# Patient Record
Sex: Female | Born: 1975 | Race: White | Hispanic: No | Smoking: Former smoker
Health system: Southern US, Community
[De-identification: ages and names within clinical notes are randomized; demographics above are authoritative.]

## PROBLEM LIST (undated history)

## (undated) DIAGNOSIS — J189 Pneumonia, unspecified organism: Secondary | ICD-10-CM

## (undated) DIAGNOSIS — M1990 Unspecified osteoarthritis, unspecified site: Secondary | ICD-10-CM

## (undated) DIAGNOSIS — J302 Other seasonal allergic rhinitis: Secondary | ICD-10-CM

## (undated) DIAGNOSIS — F419 Anxiety disorder, unspecified: Secondary | ICD-10-CM

## (undated) DIAGNOSIS — K219 Gastro-esophageal reflux disease without esophagitis: Secondary | ICD-10-CM

## (undated) DIAGNOSIS — M329 Systemic lupus erythematosus, unspecified: Secondary | ICD-10-CM

## (undated) DIAGNOSIS — J40 Bronchitis, not specified as acute or chronic: Secondary | ICD-10-CM

## (undated) HISTORY — DX: Gastro-esophageal reflux disease without esophagitis: K21.9

## (undated) HISTORY — PX: TEMPOROMANDIBULAR JOINT SURGERY: SHX35

## (undated) HISTORY — DX: Unspecified osteoarthritis, unspecified site: M19.90

## (undated) HISTORY — PX: TONSILLECTOMY: SUR1361

## (undated) HISTORY — PX: OTHER SURGICAL HISTORY: SHX169

## (undated) HISTORY — PX: TUBAL LIGATION: SHX77

## (undated) HISTORY — DX: Anxiety disorder, unspecified: F41.9

---

## 1997-05-04 ENCOUNTER — Other Ambulatory Visit: Admission: RE | Admit: 1997-05-04 | Discharge: 1997-05-04 | Payer: Self-pay | Admitting: Obstetrics

## 1997-06-21 ENCOUNTER — Ambulatory Visit (HOSPITAL_COMMUNITY): Admission: RE | Admit: 1997-06-21 | Discharge: 1997-06-21 | Payer: Self-pay | Admitting: *Deleted

## 1997-07-27 ENCOUNTER — Inpatient Hospital Stay (HOSPITAL_COMMUNITY): Admission: AD | Admit: 1997-07-27 | Discharge: 1997-07-27 | Payer: Self-pay | Admitting: *Deleted

## 1998-12-16 ENCOUNTER — Emergency Department (HOSPITAL_COMMUNITY): Admission: EM | Admit: 1998-12-16 | Discharge: 1998-12-16 | Payer: Self-pay | Admitting: Emergency Medicine

## 1999-01-10 ENCOUNTER — Encounter: Payer: Self-pay | Admitting: Family Medicine

## 1999-01-10 ENCOUNTER — Ambulatory Visit (HOSPITAL_COMMUNITY): Admission: RE | Admit: 1999-01-10 | Discharge: 1999-01-10 | Payer: Self-pay | Admitting: Family Medicine

## 1999-09-04 ENCOUNTER — Emergency Department (HOSPITAL_COMMUNITY): Admission: EM | Admit: 1999-09-04 | Discharge: 1999-09-04 | Payer: Self-pay | Admitting: Emergency Medicine

## 1999-10-22 ENCOUNTER — Encounter: Payer: Self-pay | Admitting: *Deleted

## 1999-10-22 ENCOUNTER — Emergency Department (HOSPITAL_COMMUNITY): Admission: EM | Admit: 1999-10-22 | Discharge: 1999-10-22 | Payer: Self-pay | Admitting: Emergency Medicine

## 2000-04-05 ENCOUNTER — Emergency Department (HOSPITAL_COMMUNITY): Admission: EM | Admit: 2000-04-05 | Discharge: 2000-04-05 | Payer: Self-pay | Admitting: Emergency Medicine

## 2000-04-05 ENCOUNTER — Encounter: Payer: Self-pay | Admitting: Emergency Medicine

## 2000-04-25 ENCOUNTER — Inpatient Hospital Stay (HOSPITAL_COMMUNITY): Admission: AD | Admit: 2000-04-25 | Discharge: 2000-04-25 | Payer: Self-pay | Admitting: Obstetrics

## 2000-04-28 ENCOUNTER — Inpatient Hospital Stay (HOSPITAL_COMMUNITY): Admission: AD | Admit: 2000-04-28 | Discharge: 2000-04-28 | Payer: Self-pay | Admitting: Obstetrics

## 2000-08-17 ENCOUNTER — Other Ambulatory Visit: Admission: RE | Admit: 2000-08-17 | Discharge: 2000-08-17 | Payer: Self-pay | Admitting: Obstetrics and Gynecology

## 2000-09-03 ENCOUNTER — Emergency Department (HOSPITAL_COMMUNITY): Admission: EM | Admit: 2000-09-03 | Discharge: 2000-09-04 | Payer: Self-pay | Admitting: Emergency Medicine

## 2000-11-17 ENCOUNTER — Emergency Department (HOSPITAL_COMMUNITY): Admission: EM | Admit: 2000-11-17 | Discharge: 2000-11-17 | Payer: Self-pay | Admitting: Emergency Medicine

## 2001-01-24 ENCOUNTER — Emergency Department (HOSPITAL_COMMUNITY): Admission: EM | Admit: 2001-01-24 | Discharge: 2001-01-24 | Payer: Self-pay | Admitting: Emergency Medicine

## 2001-05-04 ENCOUNTER — Other Ambulatory Visit: Admission: RE | Admit: 2001-05-04 | Discharge: 2001-05-04 | Payer: Self-pay | Admitting: Obstetrics and Gynecology

## 2001-05-25 ENCOUNTER — Emergency Department (HOSPITAL_COMMUNITY): Admission: EM | Admit: 2001-05-25 | Discharge: 2001-05-25 | Payer: Self-pay | Admitting: Emergency Medicine

## 2001-06-29 ENCOUNTER — Emergency Department (HOSPITAL_COMMUNITY): Admission: EM | Admit: 2001-06-29 | Discharge: 2001-06-29 | Payer: Self-pay | Admitting: Emergency Medicine

## 2001-12-21 ENCOUNTER — Emergency Department (HOSPITAL_COMMUNITY): Admission: EM | Admit: 2001-12-21 | Discharge: 2001-12-21 | Payer: Self-pay | Admitting: Emergency Medicine

## 2002-06-28 ENCOUNTER — Emergency Department (HOSPITAL_COMMUNITY): Admission: EM | Admit: 2002-06-28 | Discharge: 2002-06-28 | Payer: Self-pay

## 2002-10-22 ENCOUNTER — Encounter: Payer: Self-pay | Admitting: Emergency Medicine

## 2002-10-22 ENCOUNTER — Emergency Department (HOSPITAL_COMMUNITY): Admission: EM | Admit: 2002-10-22 | Discharge: 2002-10-23 | Payer: Self-pay | Admitting: Emergency Medicine

## 2002-10-27 ENCOUNTER — Encounter: Payer: Self-pay | Admitting: Family Medicine

## 2002-10-27 ENCOUNTER — Ambulatory Visit (HOSPITAL_COMMUNITY): Admission: RE | Admit: 2002-10-27 | Discharge: 2002-10-27 | Payer: Self-pay | Admitting: Family Medicine

## 2002-10-28 ENCOUNTER — Emergency Department (HOSPITAL_COMMUNITY): Admission: EM | Admit: 2002-10-28 | Discharge: 2002-10-29 | Payer: Self-pay | Admitting: *Deleted

## 2002-10-29 ENCOUNTER — Encounter: Payer: Self-pay | Admitting: Emergency Medicine

## 2002-11-07 ENCOUNTER — Emergency Department (HOSPITAL_COMMUNITY): Admission: EM | Admit: 2002-11-07 | Discharge: 2002-11-08 | Payer: Self-pay

## 2002-11-17 ENCOUNTER — Ambulatory Visit (HOSPITAL_COMMUNITY): Admission: RE | Admit: 2002-11-17 | Discharge: 2002-11-17 | Payer: Self-pay | Admitting: Gastroenterology

## 2002-11-17 ENCOUNTER — Encounter: Payer: Self-pay | Admitting: Gastroenterology

## 2002-11-21 ENCOUNTER — Encounter: Payer: Self-pay | Admitting: Gastroenterology

## 2002-11-21 ENCOUNTER — Ambulatory Visit (HOSPITAL_COMMUNITY): Admission: RE | Admit: 2002-11-21 | Discharge: 2002-11-21 | Payer: Self-pay | Admitting: Gastroenterology

## 2003-03-05 ENCOUNTER — Ambulatory Visit (HOSPITAL_COMMUNITY): Admission: RE | Admit: 2003-03-05 | Discharge: 2003-03-05 | Payer: Self-pay | Admitting: Gastroenterology

## 2003-05-17 ENCOUNTER — Inpatient Hospital Stay (HOSPITAL_COMMUNITY): Admission: AD | Admit: 2003-05-17 | Discharge: 2003-05-17 | Payer: Self-pay | Admitting: Obstetrics & Gynecology

## 2003-12-20 ENCOUNTER — Emergency Department (HOSPITAL_COMMUNITY): Admission: EM | Admit: 2003-12-20 | Discharge: 2003-12-20 | Payer: Self-pay | Admitting: Emergency Medicine

## 2004-01-01 ENCOUNTER — Other Ambulatory Visit: Admission: RE | Admit: 2004-01-01 | Discharge: 2004-01-01 | Payer: Self-pay | Admitting: Family Medicine

## 2004-01-01 ENCOUNTER — Ambulatory Visit: Payer: Self-pay | Admitting: Family Medicine

## 2004-10-29 ENCOUNTER — Emergency Department (HOSPITAL_COMMUNITY): Admission: EM | Admit: 2004-10-29 | Discharge: 2004-10-29 | Payer: Self-pay | Admitting: Emergency Medicine

## 2004-12-03 ENCOUNTER — Emergency Department (HOSPITAL_COMMUNITY): Admission: EM | Admit: 2004-12-03 | Discharge: 2004-12-04 | Payer: Self-pay | Admitting: Emergency Medicine

## 2004-12-14 ENCOUNTER — Inpatient Hospital Stay (HOSPITAL_COMMUNITY): Admission: AD | Admit: 2004-12-14 | Discharge: 2004-12-14 | Payer: Self-pay | Admitting: Family Medicine

## 2005-01-08 ENCOUNTER — Emergency Department (HOSPITAL_COMMUNITY): Admission: EM | Admit: 2005-01-08 | Discharge: 2005-01-08 | Payer: Self-pay | Admitting: Family Medicine

## 2005-02-10 ENCOUNTER — Emergency Department (HOSPITAL_COMMUNITY): Admission: EM | Admit: 2005-02-10 | Discharge: 2005-02-10 | Payer: Self-pay | Admitting: Emergency Medicine

## 2005-04-21 ENCOUNTER — Emergency Department (HOSPITAL_COMMUNITY): Admission: EM | Admit: 2005-04-21 | Discharge: 2005-04-21 | Payer: Self-pay | Admitting: Family Medicine

## 2005-09-10 ENCOUNTER — Ambulatory Visit: Payer: Self-pay | Admitting: Family Medicine

## 2005-10-01 ENCOUNTER — Encounter (INDEPENDENT_AMBULATORY_CARE_PROVIDER_SITE_OTHER): Payer: Self-pay | Admitting: Family Medicine

## 2005-10-01 ENCOUNTER — Other Ambulatory Visit: Admission: RE | Admit: 2005-10-01 | Discharge: 2005-10-01 | Payer: Self-pay | Admitting: Family Medicine

## 2005-10-01 ENCOUNTER — Ambulatory Visit: Payer: Self-pay | Admitting: Family Medicine

## 2005-10-18 ENCOUNTER — Emergency Department (HOSPITAL_COMMUNITY): Admission: EM | Admit: 2005-10-18 | Discharge: 2005-10-18 | Payer: Self-pay | Admitting: Family Medicine

## 2005-11-30 ENCOUNTER — Ambulatory Visit: Payer: Self-pay | Admitting: Internal Medicine

## 2005-12-01 ENCOUNTER — Ambulatory Visit (HOSPITAL_COMMUNITY): Admission: RE | Admit: 2005-12-01 | Discharge: 2005-12-01 | Payer: Self-pay | Admitting: Internal Medicine

## 2006-02-15 ENCOUNTER — Emergency Department (HOSPITAL_COMMUNITY): Admission: EM | Admit: 2006-02-15 | Discharge: 2006-02-15 | Payer: Self-pay | Admitting: Family Medicine

## 2006-02-23 ENCOUNTER — Ambulatory Visit: Payer: Self-pay | Admitting: Family Medicine

## 2006-06-17 ENCOUNTER — Ambulatory Visit: Payer: Self-pay | Admitting: Internal Medicine

## 2006-08-02 ENCOUNTER — Ambulatory Visit (HOSPITAL_COMMUNITY): Admission: RE | Admit: 2006-08-02 | Discharge: 2006-08-02 | Payer: Self-pay | Admitting: Internal Medicine

## 2006-08-10 ENCOUNTER — Emergency Department (HOSPITAL_COMMUNITY): Admission: EM | Admit: 2006-08-10 | Discharge: 2006-08-10 | Payer: Self-pay | Admitting: Family Medicine

## 2006-08-16 IMAGING — CR DG LUMBAR SPINE COMPLETE 4+V
5 series · 5 of 5 positions shown · non-contrast
Comparison: none

CLINICAL DATA: Low back pain after fall

LUMBAR SPINE FIVE-VIEW

[view not recorded (1 of 5)]
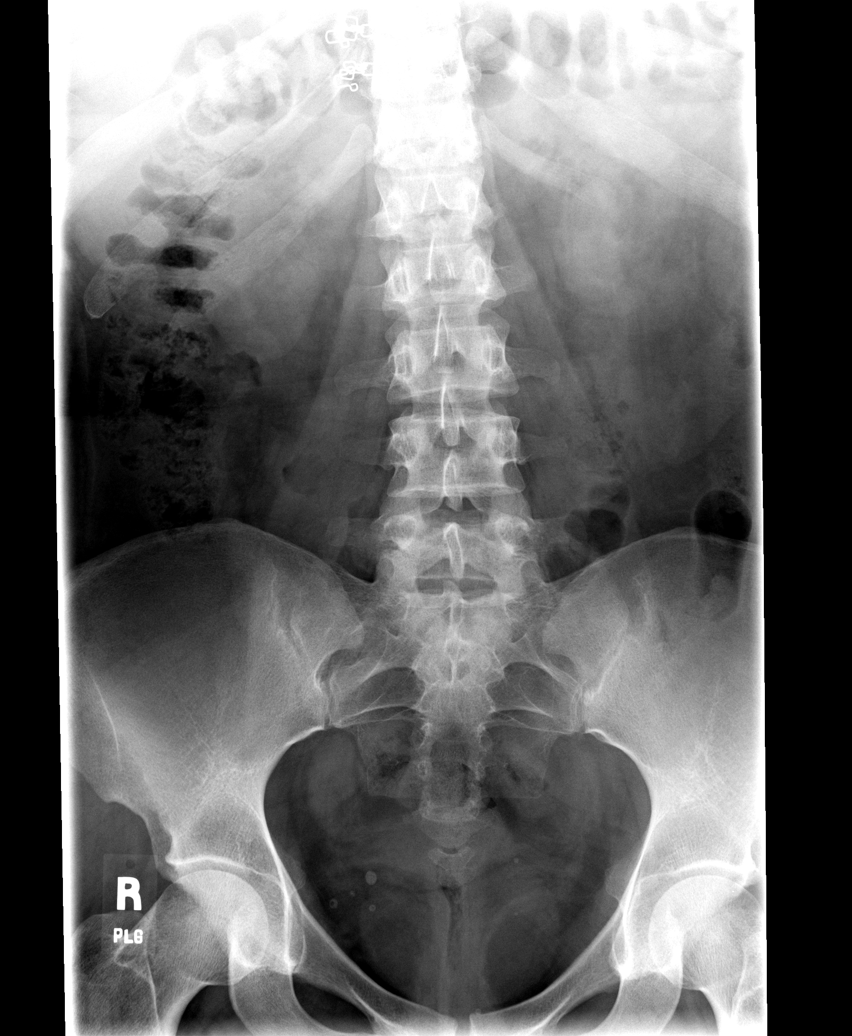

[view not recorded (2 of 5)]
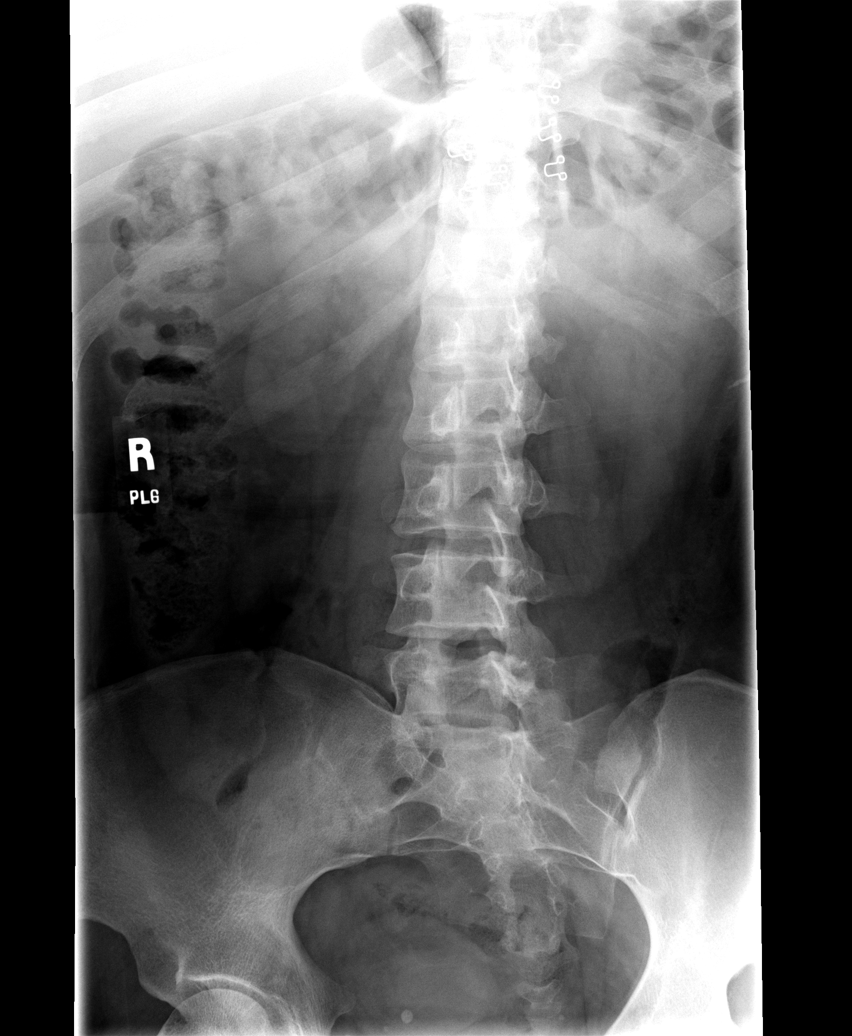

[view not recorded (3 of 5)]
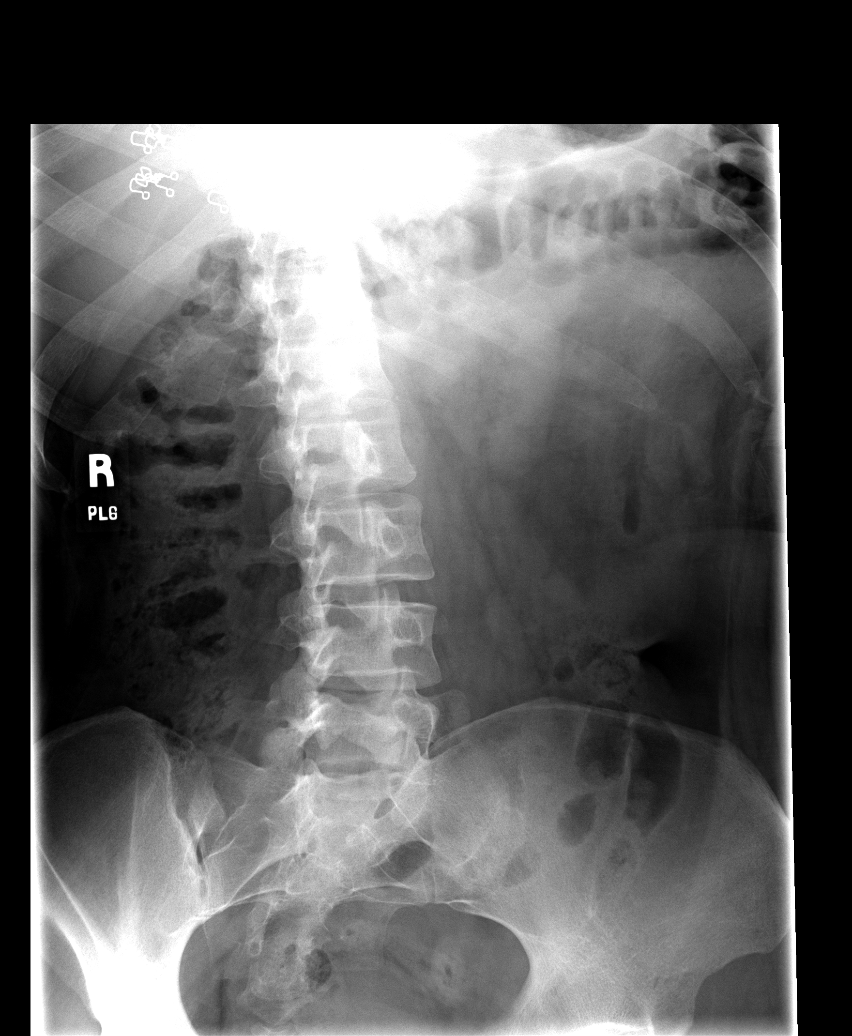

[view not recorded (4 of 5)]
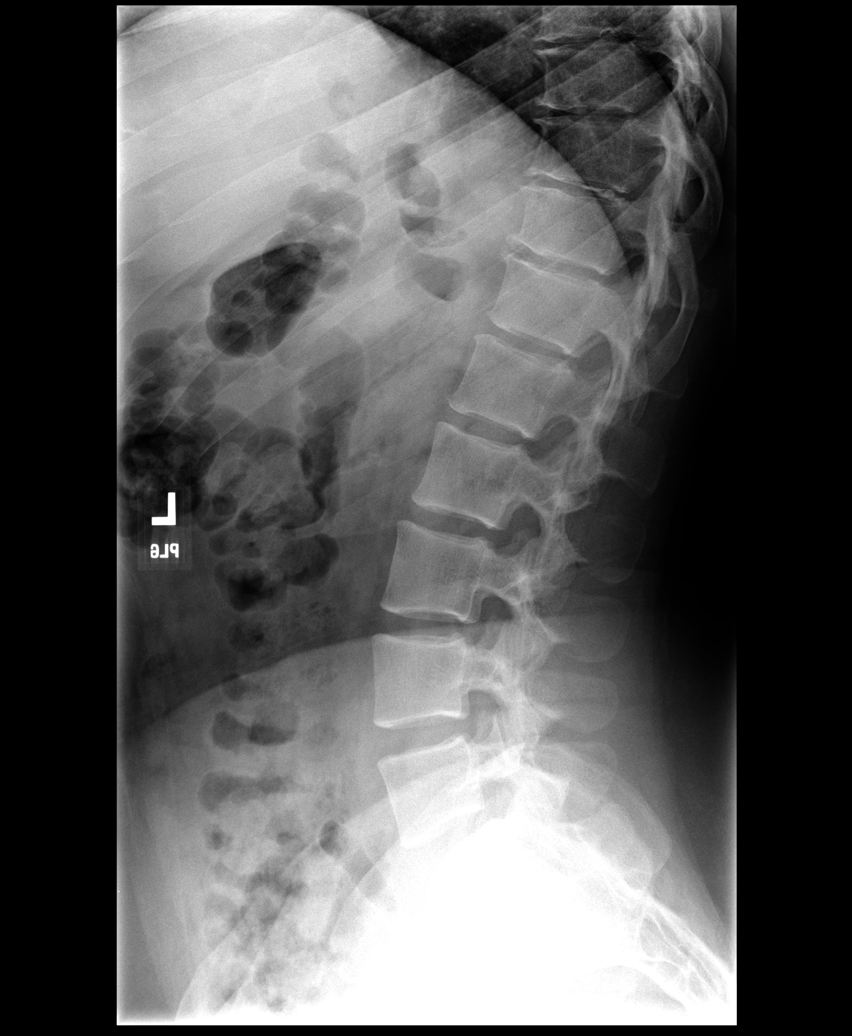

[view not recorded (5 of 5)]
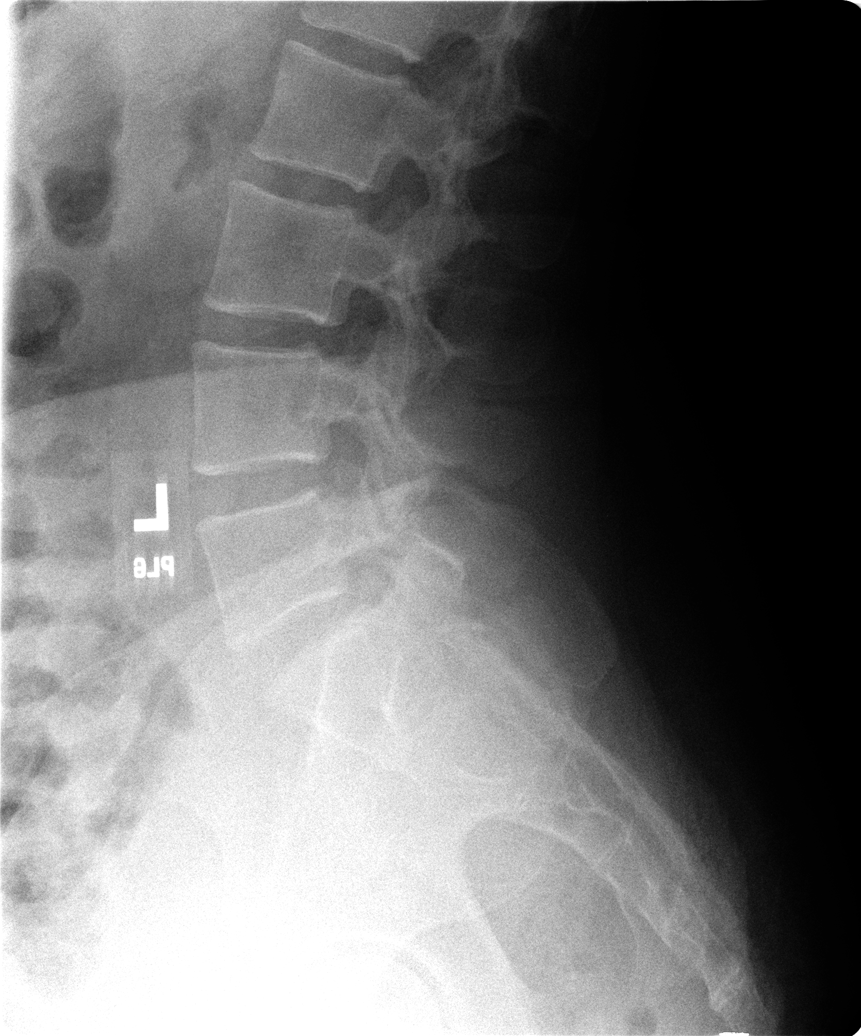

[5 of 5 positions shown; findings below may reference images not displayed]

FINDINGS: No previous for comparison. Small anterior end plate spurs are noted in the lower
thoracic spine. Normal alignment. Negative for fracture, dislocation, or other acute bone
abnormality.
IMPRESSION: 1. Negative lumbar spine.
2. Lower thoracic spine spurring.

## 2006-10-26 ENCOUNTER — Emergency Department (HOSPITAL_COMMUNITY): Admission: EM | Admit: 2006-10-26 | Discharge: 2006-10-26 | Payer: Self-pay | Admitting: Emergency Medicine

## 2006-11-07 ENCOUNTER — Emergency Department (HOSPITAL_COMMUNITY): Admission: EM | Admit: 2006-11-07 | Discharge: 2006-11-07 | Payer: Self-pay | Admitting: Emergency Medicine

## 2007-06-20 ENCOUNTER — Inpatient Hospital Stay (HOSPITAL_COMMUNITY): Admission: AD | Admit: 2007-06-20 | Discharge: 2007-06-20 | Payer: Self-pay | Admitting: Obstetrics & Gynecology

## 2007-09-28 ENCOUNTER — Emergency Department (HOSPITAL_COMMUNITY): Admission: EM | Admit: 2007-09-28 | Discharge: 2007-09-28 | Payer: Self-pay | Admitting: Emergency Medicine

## 2008-01-26 ENCOUNTER — Emergency Department (HOSPITAL_COMMUNITY): Admission: EM | Admit: 2008-01-26 | Discharge: 2008-01-26 | Payer: Self-pay | Admitting: Emergency Medicine

## 2008-01-27 ENCOUNTER — Telehealth (INDEPENDENT_AMBULATORY_CARE_PROVIDER_SITE_OTHER): Payer: Self-pay | Admitting: *Deleted

## 2008-02-03 ENCOUNTER — Ambulatory Visit: Payer: Self-pay | Admitting: Nurse Practitioner

## 2008-02-03 DIAGNOSIS — N63 Unspecified lump in unspecified breast: Secondary | ICD-10-CM

## 2008-02-13 ENCOUNTER — Encounter: Admission: RE | Admit: 2008-02-13 | Discharge: 2008-02-13 | Payer: Self-pay | Admitting: Internal Medicine

## 2008-02-21 ENCOUNTER — Emergency Department (HOSPITAL_COMMUNITY): Admission: EM | Admit: 2008-02-21 | Discharge: 2008-02-21 | Payer: Self-pay | Admitting: Emergency Medicine

## 2008-04-17 ENCOUNTER — Emergency Department (HOSPITAL_COMMUNITY): Admission: EM | Admit: 2008-04-17 | Discharge: 2008-04-18 | Payer: Self-pay | Admitting: Emergency Medicine

## 2008-04-20 ENCOUNTER — Telehealth (INDEPENDENT_AMBULATORY_CARE_PROVIDER_SITE_OTHER): Payer: Self-pay | Admitting: Internal Medicine

## 2008-10-20 ENCOUNTER — Encounter (INDEPENDENT_AMBULATORY_CARE_PROVIDER_SITE_OTHER): Payer: Self-pay | Admitting: Internal Medicine

## 2008-10-20 ENCOUNTER — Emergency Department (HOSPITAL_COMMUNITY): Admission: EM | Admit: 2008-10-20 | Discharge: 2008-10-20 | Payer: Self-pay | Admitting: Emergency Medicine

## 2008-10-23 ENCOUNTER — Emergency Department (HOSPITAL_COMMUNITY): Admission: EM | Admit: 2008-10-23 | Discharge: 2008-10-23 | Payer: Self-pay | Admitting: Emergency Medicine

## 2008-10-24 ENCOUNTER — Telehealth (INDEPENDENT_AMBULATORY_CARE_PROVIDER_SITE_OTHER): Payer: Self-pay | Admitting: Internal Medicine

## 2008-10-31 ENCOUNTER — Telehealth (INDEPENDENT_AMBULATORY_CARE_PROVIDER_SITE_OTHER): Payer: Self-pay | Admitting: Internal Medicine

## 2008-11-07 ENCOUNTER — Encounter (INDEPENDENT_AMBULATORY_CARE_PROVIDER_SITE_OTHER): Payer: Self-pay | Admitting: *Deleted

## 2008-11-13 ENCOUNTER — Ambulatory Visit: Payer: Self-pay | Admitting: Internal Medicine

## 2008-11-13 DIAGNOSIS — IMO0002 Reserved for concepts with insufficient information to code with codable children: Secondary | ICD-10-CM | POA: Insufficient documentation

## 2008-11-15 ENCOUNTER — Encounter (INDEPENDENT_AMBULATORY_CARE_PROVIDER_SITE_OTHER): Payer: Self-pay | Admitting: Internal Medicine

## 2008-11-16 ENCOUNTER — Ambulatory Visit (HOSPITAL_COMMUNITY): Admission: RE | Admit: 2008-11-16 | Discharge: 2008-11-16 | Payer: Self-pay | Admitting: Internal Medicine

## 2008-11-16 ENCOUNTER — Telehealth (INDEPENDENT_AMBULATORY_CARE_PROVIDER_SITE_OTHER): Payer: Self-pay | Admitting: Internal Medicine

## 2008-11-18 ENCOUNTER — Emergency Department (HOSPITAL_COMMUNITY): Admission: EM | Admit: 2008-11-18 | Discharge: 2008-11-18 | Payer: Self-pay | Admitting: Emergency Medicine

## 2008-11-21 ENCOUNTER — Telehealth (INDEPENDENT_AMBULATORY_CARE_PROVIDER_SITE_OTHER): Payer: Self-pay | Admitting: Internal Medicine

## 2009-07-01 ENCOUNTER — Emergency Department (HOSPITAL_COMMUNITY): Admission: EM | Admit: 2009-07-01 | Discharge: 2009-07-01 | Payer: Self-pay | Admitting: Emergency Medicine

## 2010-06-11 ENCOUNTER — Encounter: Payer: Self-pay | Admitting: Cardiovascular Disease

## 2010-06-21 LAB — URINALYSIS, ROUTINE W REFLEX MICROSCOPIC
Glucose, UA: NEGATIVE mg/dL
Ketones, ur: NEGATIVE mg/dL
Leukocytes, UA: NEGATIVE
Nitrite: NEGATIVE
Protein, ur: NEGATIVE mg/dL
pH: 6 (ref 5.0–8.0)

## 2010-06-21 LAB — POCT PREGNANCY, URINE: Preg Test, Ur: NEGATIVE

## 2010-06-21 LAB — URINE MICROSCOPIC-ADD ON

## 2010-06-25 ENCOUNTER — Ambulatory Visit: Payer: Self-pay | Admitting: Cardiovascular Disease

## 2010-07-01 LAB — GLUCOSE, CSF: Glucose, CSF: 59 mg/dL (ref 43–76)

## 2010-07-01 LAB — BASIC METABOLIC PANEL
BUN: 6 mg/dL (ref 6–23)
Creatinine, Ser: 0.77 mg/dL (ref 0.4–1.2)
GFR calc Af Amer: >60 mL/min (ref >60)
GFR calc non Af Amer: >60 mL/min (ref >60)
Sodium: 135 mEq/L (ref 135–145)

## 2010-07-01 LAB — CULTURE, BLOOD (ROUTINE X 2)
Culture: NO GROWTH
Culture: NO GROWTH

## 2010-07-01 LAB — DIFFERENTIAL
Lymphocytes Relative: 40 % (ref 12–46)
Monocytes Absolute: 0.5 10*3/uL (ref 0.1–1.0)
Neutro Abs: 5.6 10*3/uL (ref 1.7–7.7)

## 2010-07-01 LAB — CSF CELL COUNT WITH DIFFERENTIAL

## 2010-07-01 LAB — GRAM STAIN: Gram Stain: NONE SEEN

## 2010-07-01 LAB — CSF CULTURE W GRAM STAIN: Gram Stain: NONE SEEN

## 2010-07-01 LAB — CBC
MCV: 101.4 fL — ABNORMAL HIGH (ref 78.0–100.0)
RBC: 3.92 MIL/uL (ref 3.87–5.11)
RDW: 12.2 % (ref 11.5–15.5)

## 2010-07-15 ENCOUNTER — Encounter: Payer: Self-pay | Admitting: Cardiovascular Disease

## 2010-08-01 NOTE — Op Note (Signed)
NAME:  Michele Herrera, Michele Herrera                       ACCOUNT NO.:  [id number]   MEDICAL RECORD NO.:  [id number]                   PATIENT TYPE:  AMB   LOCATION:  ENDO                                 FACILITY:  [facility]   PHYSICIAN:  Graylin Shiver, M.D.                DATE OF BIRTH:  07/23/1975   DATE OF PROCEDURE:  03/05/2003  DATE OF DISCHARGE:                                 OPERATIVE REPORT   PROCEDURE PERFORMED:  Esophagogastroduodenoscopy with biopsy for CLO test.   ENDOSCOPIST:  Graylin Shiver, M.D.   INDICATIONS FOR PROCEDURE:  Epigastric pain.  Informed consent was obtained  after explanation of the risks of bleeding, infection and perforation.   PREMEDICATIONS:  Fentanyl 100 mcg IV, Versed 10 mg IV.   DESCRIPTION OF PROCEDURE:  With the patient in the left lateral decubitus  position the Olympus gastroscope was inserted into the oropharynx and passed  into the esophagus.  It was advanced down the esophagus and into the stomach  and into the duodenum.  The second portion and bulb of the duodenum were  normal.  The stomach showed a diffuse mild erythematous appearance to the  mucosa consistent with gastritis.  No ulcers or erosions were seen.  Biopsy  for CLO test was obtained.  No lesions were seen in the fundus or cardia on  retroflexion.  The esophagus was normal.  The esophagogastric junction was  at 38 cm.  The patient tolerated the procedure well without complications.   IMPRESSION:  Gastritis.   PLAN:  The CLO test will be checked.  I would recommend that she remain on a  proton pump inhibitor for suppression of acid.                                               Graylin Shiver, M.D.    SFG/MEDQ  D:  03/05/2003  T:  03/06/2003  Job:  [id number]   cc:   Fanny Dance. Rankins, M.D.  [address]  [place]  [place] [address]  Fax: [phone]

## 2010-08-13 ENCOUNTER — Encounter: Payer: Self-pay | Admitting: Cardiovascular Disease

## 2010-08-26 ENCOUNTER — Encounter: Payer: Self-pay | Admitting: Cardiovascular Disease

## 2010-12-09 LAB — WET PREP, GENITAL
Clue Cells Wet Prep HPF POC: NONE SEEN
Trich, Wet Prep: NONE SEEN

## 2010-12-09 LAB — URINALYSIS, ROUTINE W REFLEX MICROSCOPIC
Glucose, UA: NEGATIVE
Leukocytes, UA: NEGATIVE
Nitrite: NEGATIVE
pH: 5.5

## 2010-12-09 LAB — URINE MICROSCOPIC-ADD ON

## 2010-12-09 LAB — GC/CHLAMYDIA PROBE AMP, GENITAL: Chlamydia, DNA Probe: POSITIVE — AB

## 2011-02-10 ENCOUNTER — Emergency Department (INDEPENDENT_AMBULATORY_CARE_PROVIDER_SITE_OTHER)
Admission: EM | Admit: 2011-02-10 | Discharge: 2011-02-10 | Disposition: A | Discharge status: Home / Self Care | Payer: PRIVATE HEALTH INSURANCE | Source: Home / Self Care | Attending: Family Medicine | Admitting: Family Medicine

## 2011-02-10 ENCOUNTER — Encounter (HOSPITAL_COMMUNITY): Payer: Self-pay | Admitting: Cardiology

## 2011-02-10 ENCOUNTER — Ambulatory Visit (HOSPITAL_COMMUNITY)
Admission: RE | Admit: 2011-02-10 | Discharge: 2011-02-10 | Disposition: A | Discharge status: Home / Self Care | Payer: PRIVATE HEALTH INSURANCE | Source: Ambulatory Visit | Attending: Family Medicine | Admitting: Family Medicine

## 2011-02-10 DIAGNOSIS — J40 Bronchitis, not specified as acute or chronic: Secondary | ICD-10-CM

## 2011-02-10 DIAGNOSIS — R05 Cough: Secondary | ICD-10-CM | POA: Insufficient documentation

## 2011-02-10 DIAGNOSIS — R059 Cough, unspecified: Secondary | ICD-10-CM | POA: Insufficient documentation

## 2011-02-10 DIAGNOSIS — R509 Fever, unspecified: Secondary | ICD-10-CM | POA: Insufficient documentation

## 2011-02-10 MED ORDER — GUAIFENESIN-CODEINE 100-10 MG/5ML PO SYRP: 10.0000 mL | ORAL_SOLUTION | Freq: Three times a day (TID) | ORAL | Status: AC | PRN

## 2011-02-10 MED ORDER — CEFUROXIME AXETIL 250 MG PO TABS: 250.0000 mg | ORAL_TABLET | Freq: Two times a day (BID) | ORAL | Status: AC

## 2011-02-10 NOTE — ED Provider Notes (Signed)
History     CSN: [id number] Arrival date & time: 02/10/2011 10:32 AM   First MD Initiated Contact with Patient 02/10/11 1104      Chief Complaint  Patient presents with  . Cough  . Chest Pain  . Nasal Congestion  . Sore Throat    (Consider location/radiation/quality/duration/timing/severity/associated sxs/prior treatment) Patient is a 35 y.o. female presenting with cough, chest pain, and pharyngitis. The history is provided by the patient.  Cough This is a new problem. The current episode started more than 2 days ago. The problem occurs constantly. The cough is productive of purulent sputum. There has been no fever. Associated symptoms include chest pain, chills, rhinorrhea, sore throat and shortness of breath. She is a smoker.  Chest Pain Primary symptoms include shortness of breath and cough. Pertinent negatives for primary symptoms include no fever.    Sore Throat Associated symptoms include chest pain and shortness of breath.    Past Medical History  Diagnosis Date  . GERD (gastroesophageal reflux disease)     Past Surgical History  Procedure Date  . Tonsillectomy   . Tubal ligation   . Tmj surgeries     Family History  Problem Relation Age of Onset  . Coronary artery disease    . Cancer    . Arthritis      History  Substance Use Topics  . Smoking status: Current Everyday Smoker  . Smokeless tobacco: Not on file  . Alcohol Use: Yes    OB History    Grav Para Term Preterm Abortions TAB SAB Ect Mult Living                  Review of Systems  Constitutional: Positive for chills. Negative for fever.  HENT: Positive for congestion, sore throat, rhinorrhea and postnasal drip.   Respiratory: Positive for cough and shortness of breath.   Cardiovascular: Positive for chest pain.  Gastrointestinal: Negative.   Skin: Negative.     Allergies  Review of patient's allergies indicates no known allergies.  Home Medications   Current Outpatient Rx  Name  Route Sig Dispense Refill  . OVER THE COUNTER MEDICATION  Pt stated taking nyquil and dayquil with no relief.     . CYCLOBENZAPRINE HCL 10 MG PO TABS Oral Take 10 mg by mouth 3 (three) times daily as needed.      Marland Kitchen TRAMADOL HCL 50 MG PO TABS Oral Take 50 mg by mouth every 6 (six) hours as needed.        BP 108/67  Pulse 66  Temp(Src) 98.2 F (36.8 C) (Oral)  Resp 20  SpO2 100%  LMP 02/08/2011  Physical Exam  Nursing note and vitals reviewed. Constitutional: She appears well-developed and well-nourished.  HENT:  Head: Normocephalic.  Right Ear: External ear normal.  Left Ear: External ear normal.  Mouth/Throat: Oropharynx is clear and moist.  Eyes: Conjunctivae and EOM are normal. Pupils are equal, round, and reactive to light.  Neck: Normal range of motion. Neck supple.  Cardiovascular: Normal rate, regular rhythm, normal heart sounds and intact distal pulses.   Pulmonary/Chest: Effort normal. She has rales in the right middle field.  Abdominal: Soft. Bowel sounds are normal.  Lymphadenopathy:    She has no cervical adenopathy.  Skin: Skin is warm and dry.    ED Course  Procedures (including critical care time)  Labs Reviewed - No data to display No results found.   No diagnosis found.    MDM  X-rays reviewed  and report per radiologist.         Barkley Bruns, MD 02/10/11 1253

## 2011-02-10 NOTE — ED Notes (Signed)
Pt reports nasal congestion and cough for the past 4 days. With chest soreness from coughing. Can't take a deep breath without coughing. Pt denies fever but getting cold chills since Friday. Pt reports throat to be scratchy. Pt tol liquids poor appetite.

## 2011-08-02 ENCOUNTER — Encounter (HOSPITAL_COMMUNITY): Payer: Self-pay | Admitting: *Deleted

## 2011-08-02 ENCOUNTER — Emergency Department (INDEPENDENT_AMBULATORY_CARE_PROVIDER_SITE_OTHER)
Admission: EM | Admit: 2011-08-02 | Discharge: 2011-08-02 | Disposition: A | Discharge status: Home / Self Care | Payer: PRIVATE HEALTH INSURANCE | Source: Home / Self Care | Attending: Emergency Medicine | Admitting: Emergency Medicine

## 2011-08-02 DIAGNOSIS — J40 Bronchitis, not specified as acute or chronic: Secondary | ICD-10-CM

## 2011-08-02 HISTORY — DX: Pneumonia, unspecified organism: J18.9

## 2011-08-02 HISTORY — DX: Other seasonal allergic rhinitis: J30.2

## 2011-08-02 HISTORY — DX: Bronchitis, not specified as acute or chronic: J40

## 2011-08-02 MED ORDER — AZITHROMYCIN 250 MG PO TABS: 250.0000 mg | ORAL_TABLET | Freq: Every day | ORAL | Status: AC

## 2011-08-02 MED ORDER — DEXAMETHASONE 4 MG PO TABS: ORAL_TABLET | ORAL | Status: DC

## 2011-08-02 MED ORDER — DEXAMETHASONE 4 MG PO TABS: 16.0000 mg | ORAL_TABLET | Freq: Two times a day (BID) | ORAL | Status: DC

## 2011-08-02 MED ORDER — GUAIFENESIN-CODEINE 100-10 MG/5ML PO SYRP: 5.0000 mL | ORAL_SOLUTION | Freq: Four times a day (QID) | ORAL | Status: AC | PRN

## 2011-08-02 MED ORDER — ALBUTEROL SULFATE HFA 108 (90 BASE) MCG/ACT IN AERS: 1.0000 | INHALATION_SPRAY | Freq: Four times a day (QID) | RESPIRATORY_TRACT | Status: DC | PRN

## 2011-08-02 MED ORDER — IBUPROFEN 600 MG PO TABS: 600.0000 mg | ORAL_TABLET | Freq: Four times a day (QID) | ORAL | Status: AC | PRN

## 2011-08-02 NOTE — ED Notes (Signed)
Pt with c/o cough/congestion onset x 3 days - bilateral rib pain - pt feels unable to take deep breath - taking otc meds without relief

## 2011-08-02 NOTE — ED Provider Notes (Signed)
History     CSN: [id number]  Arrival date & time 08/02/11  1451   First MD Initiated Contact with Patient 08/02/11 1500      Chief Complaint  Patient presents with  . Cough  . Nasal Congestion  . Chest Pain    (Consider location/radiation/quality/duration/timing/severity/associated sxs/prior treatment) HPI Comments: Patient reports cough productive of thick, yellowish sputum, chest tightness, wheezing, shortness of breath, fatigue. Reports achy chest pain after coughing. States she feels" sore " from the coughing and increased work of breathing. Unable asleep at night secondary to all the coughing. Also with rhinorrhea, postnasal drip, and sore, irritated throat. Is taking DayQuil, NyQuil without relief. No nausea, vomiting.  States she feels hot and cold, but has no documented fevers at home. Patient is a smoker. She states this feels similar to previous episodes of bronchitis/pneumonia. No abx in the past month.  Patient is a 36 y.o. female presenting with cough and chest pain. The history is provided by the patient. No language interpreter was used.  Cough This is a new problem. The current episode started more than 2 days ago. The problem occurs constantly. The problem has not changed since onset.The cough is productive of sputum. There has been no fever. Associated symptoms include chest pain, chills, rhinorrhea, sore throat, shortness of breath and wheezing. Pertinent negatives include no headaches and no myalgias. She has tried cough syrup for the symptoms. The treatment provided no relief. She is a smoker. Her past medical history is significant for bronchitis and pneumonia.  Chest Pain Primary symptoms include shortness of breath, cough and wheezing. Pertinent negatives for primary symptoms include no fever and no abdominal pain.     Past Medical History  Diagnosis Date  . GERD (gastroesophageal reflux disease)   . Seasonal allergies   . Bronchitis   . Pneumonia     Past  Surgical History  Procedure Date  . Tonsillectomy   . Tubal ligation   . Tmj surgeries     Family History  Problem Relation Age of Onset  . Coronary artery disease    . Cancer    . Arthritis      History  Substance Use Topics  . Smoking status: Current Everyday Smoker  . Smokeless tobacco: Not on file  . Alcohol Use: Yes    OB History    Grav Para Term Preterm Abortions TAB SAB Ect Mult Living                  Review of Systems  Constitutional: Positive for chills. Negative for fever.  HENT: Positive for sore throat and rhinorrhea.   Respiratory: Positive for cough, shortness of breath and wheezing.   Cardiovascular: Positive for chest pain. Negative for leg swelling.  Gastrointestinal: Negative for abdominal pain.  Musculoskeletal: Negative for myalgias.  Neurological: Negative for headaches.    Allergies  Review of patient's allergies indicates no known allergies.  Home Medications   Current Outpatient Rx  Name Route Sig Dispense Refill  . ALBUTEROL SULFATE HFA 108 (90 BASE) MCG/ACT IN AERS Inhalation Inhale 1-2 puffs into the lungs every 6 (six) hours as needed for wheezing. Dispense with aerochamber 1 Inhaler 0  . AZITHROMYCIN 250 MG PO TABS Oral Take 1 tablet (250 mg total) by mouth daily. 2 tabs po on day one, then one tablet po once daily on days 2-5. 6 tablet 0  . DEXAMETHASONE 4 MG PO TABS  4 tabs (16 mg)  po at once on day one,  4 tabs (16 mg)  po at once on day 2 8 tablet 0  . GUAIFENESIN-CODEINE 100-10 MG/5ML PO SYRP Oral Take 5 mLs by mouth 4 (four) times daily as needed for cough. 120 mL 0  . IBUPROFEN 600 MG PO TABS Oral Take 1 tablet (600 mg total) by mouth every 6 (six) hours as needed for pain. 30 tablet 0    BP 119/63  Pulse 87  Temp(Src) 99.4 F (37.4 C) (Oral)  Resp 18  SpO2 97%  LMP 07/16/2011  Physical Exam  Nursing note and vitals reviewed. Constitutional: She is oriented to person, place, and time. She appears well-developed and  well-nourished. No distress.  HENT:  Head: Normocephalic and atraumatic.  Nose: Nose normal.  Mouth/Throat: Oropharynx is clear and moist.       Tonsils surgically absent.   Eyes: Conjunctivae and EOM are normal.  Neck: Normal range of motion.  Cardiovascular: Normal rate, regular rhythm, normal heart sounds and intact distal pulses.   Pulmonary/Chest: Effort normal. No respiratory distress. She exhibits tenderness.       Scattered rhonchi that clear with coughing  Abdominal: Soft. Bowel sounds are normal. She exhibits no distension. There is no tenderness.  Musculoskeletal: Normal range of motion. She exhibits no edema.  Neurological: She is alert and oriented to person, place, and time.  Skin: Skin is warm and dry.  Psychiatric: She has a normal mood and affect. Her behavior is normal. Judgment and thought content normal.    ED Course  Procedures (including critical care time)  Labs Reviewed - No data to display No results found.   1. Bronchitis     MDM  Afebrile, satting well, occasional rhonchi clear with coughing on exam here today. Patient appears well. Deferring x-ray today. Patient is a smoker, so will send home with a Z-Pak to treat her bronchitis in addition steroids, beta agonists, antitussives.. Counseled patient about the importance of smoking cessation. She'll followup with her PMD  as needed.  Luiz Blare, MD 08/02/11 [phone]

## 2011-08-02 NOTE — Discharge Instructions (Signed)
Take two puffs from your albuterol inhaler every 4 hours. Finish the steroids unless your doctor tells you to stop. Finish the antibiotics, even if you feel better. You may take tylenol 1 gram up to 4 times a day as needed for pain. This with 600 mg of motrin is an effective combination for pain and fever. Make sure you drink extra fluids. Return if you get worse, have a fever >100.4, or any other concerns.   Go to www.goodrx.com to look up your medications. This will give you a list of where you can find your prescriptions at the most affordable prices.  

## 2011-08-07 ENCOUNTER — Emergency Department (INDEPENDENT_AMBULATORY_CARE_PROVIDER_SITE_OTHER)
Admission: EM | Admit: 2011-08-07 | Discharge: 2011-08-07 | Disposition: A | Discharge status: Home / Self Care | Payer: PRIVATE HEALTH INSURANCE | Source: Home / Self Care | Attending: Emergency Medicine | Admitting: Emergency Medicine

## 2011-08-07 ENCOUNTER — Encounter (HOSPITAL_COMMUNITY): Payer: Self-pay | Admitting: Emergency Medicine

## 2011-08-07 ENCOUNTER — Emergency Department (INDEPENDENT_AMBULATORY_CARE_PROVIDER_SITE_OTHER): Payer: PRIVATE HEALTH INSURANCE

## 2011-08-07 DIAGNOSIS — R0602 Shortness of breath: Secondary | ICD-10-CM

## 2011-08-07 DIAGNOSIS — R05 Cough: Secondary | ICD-10-CM

## 2011-08-07 MED ORDER — PREDNISONE 10 MG PO TABS: 40.0000 mg | ORAL_TABLET | Freq: Every day | ORAL | Status: AC

## 2011-08-07 MED ORDER — MOMETASONE FUROATE 50 MCG/ACT NA SUSP: 2.0000 | Freq: Every day | NASAL | Status: DC

## 2011-08-07 NOTE — ED Provider Notes (Signed)
History     CSN: [id number]  Arrival date & time 08/07/11  1006   First MD Initiated Contact with Patient 08/07/11 1009      Chief Complaint  Patient presents with  . Shortness of Breath    (Consider location/radiation/quality/duration/timing/severity/associated sxs/prior treatment) HPI Comments: Presents urgent care today complaining of an ongoing cough and intermittent shortness of breath with chest wall discomfort during coughing spells. She had completed and antibiotics cycle of prednisone cycle with minimal relief. She describes her symptoms have worsened within the last 2-3 days the cough is deep and hotter and more frequently to the point that it is making her chest hurts with coughing spells. Patient also been also to shortness of breath continues and that she feels of neutral is not helping in any significant way. She denies any fevers, but admits that she continues to have a runny congested nose was wondered if her symptoms could be related to allergies.  Patient is a 36 y.o. female presenting with shortness of breath. The history is provided by the patient.  Shortness of Breath  The current episode started more than 2 weeks ago. The problem occurs frequently. The problem has been unchanged. The problem is moderate. The symptoms are relieved by nothing. Associated symptoms include cough, shortness of breath and wheezing. Pertinent negatives include no fever. She has had intermittent steroid use.    Past Medical History  Diagnosis Date  . GERD (gastroesophageal reflux disease)   . Seasonal allergies   . Bronchitis   . Pneumonia     Past Surgical History  Procedure Date  . Tonsillectomy   . Tubal ligation   . Tmj surgeries     Family History  Problem Relation Age of Onset  . Coronary artery disease    . Cancer    . Arthritis      History  Substance Use Topics  . Smoking status: Current Everyday Smoker -- 1.0 packs/day  . Smokeless tobacco: Not on file  .  Alcohol Use: Yes     ocassional    OB History    Grav Para Term Preterm Abortions TAB SAB Ect Mult Living                  Review of Systems  Constitutional: Negative for fever, chills, activity change and appetite change.  Respiratory: Positive for cough, chest tightness, shortness of breath and wheezing.   Skin: Negative for rash.    Allergies  Review of patient's allergies indicates no known allergies.  Home Medications   Current Outpatient Rx  Name Route Sig Dispense Refill  . ALBUTEROL SULFATE HFA 108 (90 BASE) MCG/ACT IN AERS Inhalation Inhale 1-2 puffs into the lungs every 6 (six) hours as needed for wheezing. Dispense with aerochamber 1 Inhaler 0  . DEXAMETHASONE 4 MG PO TABS  4 tabs (16 mg)  po at once on day one, 4 tabs (16 mg)  po at once on day 2 8 tablet 0  . GUAIFENESIN-CODEINE 100-10 MG/5ML PO SYRP Oral Take 5 mLs by mouth 4 (four) times daily as needed for cough. 120 mL 0  . IBUPROFEN 600 MG PO TABS Oral Take 1 tablet (600 mg total) by mouth every 6 (six) hours as needed for pain. 30 tablet 0  . AZITHROMYCIN 250 MG PO TABS Oral Take 1 tablet (250 mg total) by mouth daily. 2 tabs po on day one, then one tablet po once daily on days 2-5. 6 tablet 0  . MOMETASONE FUROATE  50 MCG/ACT NA SUSP Nasal Place 2 sprays into the nose daily. 17 g 12  . PREDNISONE 10 MG PO TABS Oral Take 4 tablets (40 mg total) by mouth daily. 15 tablet 0    BP 122/78  Pulse 72  Temp(Src) 98.5 F (36.9 C) (Oral)  Resp 22  SpO2 100%  LMP 08/03/2011  Physical Exam  Vitals reviewed. Constitutional: Vital signs are normal. She appears well-developed and well-nourished. She is active.  Non-toxic appearance. She does not have a sickly appearance. She does not appear ill. No distress.  HENT:  Head: Normocephalic.  Eyes: Conjunctivae are normal.  Neck: Neck supple.  Pulmonary/Chest: No respiratory distress. She has decreased breath sounds. She has no wheezes. She has no rhonchi. She has no  rales. She exhibits tenderness.  Skin: No rash noted. No erythema.    ED Course  Procedures (including critical care time)  Labs Reviewed - No data to display Dg Chest 2 View  08/07/2011  *RADIOLOGY REPORT*  Clinical Data: Shortness of breath.  History of smoking.  CHEST - 2 VIEW  Comparison: 02/10/2011  Findings: Two views of the chest demonstrate slightly low lung volumes but the lungs are clear. Heart and mediastinum are within normal limits.  Bony structures are intact.  IMPRESSION: Slightly low lung volumes but no focal disease.  Original Report Authenticated By: Richarda Overlie, M.D.     1. Cough   2. Shortness of breath       MDM  Patient with persistent cough and chest wall pain exam symptoms and x-rays were consistent with a residual possibly allergenic cough with musculoskeletal chest wall pain. Have encouraged patient to you to a second course of longer prednisone and a nasal steroid as patient persists with rhinorrhea and nasal congestion. Patient with treatment plan and followup care as necessary.        Jimmie Molly, MD 08/07/11 [phone]

## 2011-08-07 NOTE — ED Notes (Signed)
Pt states she was here on Sunday and was prescribed antibiotics, inhaler, and steriods which she has finished without relief. Pt c/o chest pain especially when coughing. Pt states her skin hurts to touch on her head, face, and torso. Pt states she still has SOB, severe cough, and chest pain.

## 2011-08-07 NOTE — Discharge Instructions (Signed)
  Your ongoing cough possibly allergy induced take 7 days prednisone and start with this nasal steroid spray. If worsening shortness of breath or fevers should go to the emergency department for further evaluation. Cough, Adult  A cough is a reflex that helps clear your throat and airways. It can help heal the body or may be a reaction to an irritated airway. A cough may only last 2 or 3 weeks (acute) or may last more than 8 weeks (chronic).  CAUSES Acute cough:  Viral or bacterial infections.  Chronic cough:  Infections.   Allergies.   Asthma.   Post-nasal drip.   Smoking.   Heartburn or acid reflux.   Some medicines.   Chronic lung problems (COPD).   Cancer.  SYMPTOMS   Cough.   Fever.   Chest pain.   Increased breathing rate.   High-pitched whistling sound when breathing (wheezing).   Colored mucus that you cough up (sputum).  TREATMENT   A bacterial cough may be treated with antibiotic medicine.   A viral cough must run its course and will not respond to antibiotics.   Your caregiver may recommend other treatments if you have a chronic cough.  HOME CARE INSTRUCTIONS   Only take over-the-counter or prescription medicines for pain, discomfort, or fever as directed by your caregiver. Use cough suppressants only as directed by your caregiver.   Use a cold steam vaporizer or humidifier in your bedroom or home to help loosen secretions.   Sleep in a semi-upright position if your cough is worse at night.   Rest as needed.   Stop smoking if you smoke.  SEEK IMMEDIATE MEDICAL CARE IF:   You have pus in your sputum.   Your cough starts to worsen.   You cannot control your cough with suppressants and are losing sleep.   You begin coughing up blood.   You have difficulty breathing.   You develop pain which is getting worse or is uncontrolled with medicine.   You have a fever.  MAKE SURE YOU:   Understand these instructions.   Will watch your  condition.   Will get help right away if you are not doing well or get worse.  Document Released: 08/29/2010 Document Revised: 02/19/2011 Document Reviewed: 08/29/2010 [facility] Patient Information 2012 [place], [place].

## 2013-03-13 ENCOUNTER — Ambulatory Visit (INDEPENDENT_AMBULATORY_CARE_PROVIDER_SITE_OTHER): Payer: BC Managed Care – HMO | Admitting: Internal Medicine

## 2013-03-13 ENCOUNTER — Ambulatory Visit: Payer: BC Managed Care – HMO

## 2013-03-13 VITALS — BP 116/72 | HR 80 | Temp 98.5°F | Resp 18 | Ht 65.0 in | Wt 187.0 lb

## 2013-03-13 DIAGNOSIS — M79609 Pain in unspecified limb: Secondary | ICD-10-CM

## 2013-03-13 DIAGNOSIS — M722 Plantar fascial fibromatosis: Secondary | ICD-10-CM

## 2013-03-13 DIAGNOSIS — M79672 Pain in left foot: Secondary | ICD-10-CM

## 2013-03-13 MED ORDER — MELOXICAM 15 MG PO TABS: 15.0000 mg | ORAL_TABLET | Freq: Every day | ORAL | Status: DC

## 2013-03-13 NOTE — Progress Notes (Addendum)
This chart was scribed for Michele Sia, MD by Joaquin Music, ED Scribe. This patient was seen in room Room/bed 13 and the patient's care was started at 9:55 AM. Subjective:    Patient ID: Michele Herrera, female    DOB: 1975/05/02, 37 y.o.   MRN: [id number] Chief Complaint  Patient presents with  . Foot Pain    started 2 days ago. lt heel pain    HPI Michele Herrera is a 37 y.o. female who presents to the [facility] complaining of with ongoing worsening L foot pain that began 2 days ago. Pt states she is unsure what has caused her L foot/heel to be in excruciating pain. She states she has ben very tender to palpitation or weightbearing. Pt denies recently changing her shoes. She denies over using L foot. 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 Pt denies any recent injuries/traumas. Pain seems worse first thing in the morning.  History   Social History  . Marital Status: Divorced    Spouse Name: N/A    Number of Children: N/A  . Years of Education: N/A   Social History Main Topics  . Smoking status: Current Every Day Smoker -- 1.00 packs/day  . Smokeless tobacco: None  . Alcohol Use: Yes     Comment: ocassional  . Drug Use: No  . Sexual Activity: Yes    Birth Control/ Protection: None   Other Topics Concern  . None   Social History Narrative  . None   Past Surgical History  Procedure Laterality Date  . Tonsillectomy    . Tubal ligation    . Tmj surgeries    . Temporomandibular joint surgery     Family History  Problem Relation Age of Onset  . Coronary artery disease    . Cancer    . Arthritis    . Cancer Mother   . Hypertension Mother   . Hypertension Father   . Cancer Maternal Grandmother   . Cancer Maternal Grandfather   . Hypertension Maternal Grandfather   . Cancer Paternal Grandmother   . Hypertension Paternal Grandmother   . Heart disease Paternal Grandmother   . Cancer Paternal  Grandfather    Not on any chronic medication   Review of Systems A complete 10 system review of systems was obtained and all systems are negative except as noted in the HPI and PMH.   Objective:   Physical Exam  Constitutional: She appears well-developed and well-nourished. No distress.  Overweight  Musculoskeletal:       Left foot: She exhibits tenderness and swelling. She exhibits normal range of motion, no deformity and no laceration.  Tender palpation of the insertion of the plantar fascia into the calcaneus and also with squeezing of the calcaneus Dorsiflexion of the foot is negative for pain The ankle is otherwise stable She is mildly swollen over the instep but no cellulitis  Achilles intact/ gastroc intact  Skin: She is not diaphoretic.      [facility] reading (PRIMARY) by  Dr. Merla Riches: Heel spur at achilles attachment and heel spur at plantars fascia attachment.    BP 116/72  Pulse 80  Temp(Src) 98.5 F (36.9 C) (Oral)  Resp 18  Ht 5\' 5"  (1.651 m)  Wt 187 lb (84.823 kg)  BMI 31.12 kg/m2  SpO2 100%  LMP 03/07/2013 Assessment & Plan:    I personally performed the services described in this documentation, which was  scribed in my presence. The recorded information has been reviewed and is accurate.  Pain, heel, left - Plan: DG Os Calcis Left  Plantar fasciitis of left foot  Meds ordered this encounter  Medications  . meloxicam (MOBIC) 15 MG tablet    Sig: Take 1 tablet (15 mg total) by mouth daily.    Dispense:  30 tablet    Refill:  0   Patient Instructions  Plantar Fasciitis (Heel Spur Syndrome) Plantar Fasciitis Plantar fasciitis is a common condition that causes foot pain. It is soreness (inflammation) of the band of tough fibrous tissue on the bottom of the foot that runs from the heel bone (calcaneus) to the ball of the foot. The cause of this soreness may be from excessive standing, poor fitting shoes, running on hard surfaces, being overweight, having  an abnormal walk, or overuse (this is common in runners) of the painful foot or feet. It is also common in aerobic exercise dancers and ballet dancers. SYMPTOMS  Most people with plantar fasciitis complain of:  Severe pain in the morning on the bottom of their foot especially when taking the first steps out of bed. This pain recedes after a few minutes of walking.  Severe pain is experienced also during walking following a long period of inactivity.  Pain is worse when walking barefoot or up stairs DIAGNOSIS   Your caregiver will diagnose this condition by examining and feeling your foot.  Special tests such as X-rays of your foot, are usually not needed. PREVENTION   Consult a sports medicine professional before beginning a new exercise program.  Walking programs offer a good workout. With walking there is a lower chance of overuse injuries common to runners. There is less impact and less jarring of the joints.  Begin all new exercise programs slowly. If problems or pain develop, decrease the amount of time or distance until you are at a comfortable level.  Wear good shoes and replace them regularly.  Stretch your foot and the heel cords at the back of the ankle (Achilles tendon) both before and after exercise.  Run or exercise on even surfaces that are not hard. For example, asphalt is better than pavement.  Do not run barefoot on hard surfaces.  If using a treadmill, vary the incline.  Do not continue to workout if you have foot or joint problems. Seek professional help if they do not improve. HOME CARE INSTRUCTIONS   Avoid activities that cause you pain until you recover.  Use ice or cold packs on the problem or painful areas after working out.  Only take over-the-counter or prescription medicines for pain, discomfort, or fever as directed by your caregiver.  Soft shoe inserts or athletic shoes with air or gel sole cushions may be helpful.  If problems continue or  become more severe, consult a sports medicine caregiver or your own health care provider. Cortisone is a potent anti-inflammatory medication that may be injected into the painful area. You can discuss this treatment with your caregiver. MAKE SURE YOU:   Understand these instructions.  Will watch your condition.  Will get help right away if you are not doing well or get worse. Document Released: 11/25/2000 Document Revised: 05/25/2011 Document Reviewed: 01/25/2008 [facility] Patient Information 2014 [place], [place].    Stretch well in the morning before walking Ice heel in the evening after work 20 minutes Use a heel pad for support and cushion Wear supportive footwear Consider ordering prostep 2 online for a stretching device  Take meloxicam daily If no improvement in one month we will refer you to physical therapy

## 2013-03-13 NOTE — Patient Instructions (Signed)
Plantar Fasciitis (Heel Spur Syndrome) Plantar Fasciitis Plantar fasciitis is a common condition that causes foot pain. It is soreness (inflammation) of the band of tough fibrous tissue on the bottom of the foot that runs from the heel bone (calcaneus) to the ball of the foot. The cause of this soreness may be from excessive standing, poor fitting shoes, running on hard surfaces, being overweight, having an abnormal walk, or overuse (this is common in runners) of the painful foot or feet. It is also common in aerobic exercise dancers and ballet dancers. SYMPTOMS  Most people with plantar fasciitis complain of:  Severe pain in the morning on the bottom of their foot especially when taking the first steps out of bed. This pain recedes after a few minutes of walking.  Severe pain is experienced also during walking following a long period of inactivity.  Pain is worse when walking barefoot or up stairs DIAGNOSIS   Your caregiver will diagnose this condition by examining and feeling your foot.  Special tests such as X-rays of your foot, are usually not needed. PREVENTION   Consult a sports medicine professional before beginning a new exercise program.  Walking programs offer a good workout. With walking there is a lower chance of overuse injuries common to runners. There is less impact and less jarring of the joints.  Begin all new exercise programs slowly. If problems or pain develop, decrease the amount of time or distance until you are at a comfortable level.  Wear good shoes and replace them regularly.  Stretch your foot and the heel cords at the back of the ankle (Achilles tendon) both before and after exercise.  Run or exercise on even surfaces that are not hard. For example, asphalt is better than pavement.  Do not run barefoot on hard surfaces.  If using a treadmill, vary the incline.  Do not continue to workout if you have foot or joint problems. Seek professional help if they  do not improve. HOME CARE INSTRUCTIONS   Avoid activities that cause you pain until you recover.  Use ice or cold packs on the problem or painful areas after working out.  Only take over-the-counter or prescription medicines for pain, discomfort, or fever as directed by your caregiver.  Soft shoe inserts or athletic shoes with air or gel sole cushions may be helpful.  If problems continue or become more severe, consult a sports medicine caregiver or your own health care provider. Cortisone is a potent anti-inflammatory medication that may be injected into the painful area. You can discuss this treatment with your caregiver. MAKE SURE YOU:   Understand these instructions.  Will watch your condition.  Will get help right away if you are not doing well or get worse. Document Released: 11/25/2000 Document Revised: 05/25/2011 Document Reviewed: 01/25/2008 [facility] Patient Information 2014 [place], [place].    Stretch well in the morning before walking Ice heel in the evening after work 20 minutes Use a heel pad for support and cushion Wear supportive footwear Consider ordering prostep 2 online for a stretching device Take meloxicam daily If no improvement in one month we will refer you to physical therapy

## 2018-06-24 ENCOUNTER — Telehealth: Payer: PRIVATE HEALTH INSURANCE | Admitting: Family

## 2018-06-24 ENCOUNTER — Emergency Department (HOSPITAL_COMMUNITY): Payer: Commercial Managed Care - PPO

## 2018-06-24 ENCOUNTER — Other Ambulatory Visit: Payer: Self-pay

## 2018-06-24 ENCOUNTER — Encounter (HOSPITAL_COMMUNITY): Payer: Self-pay | Admitting: Emergency Medicine

## 2018-06-24 ENCOUNTER — Emergency Department (HOSPITAL_COMMUNITY)
Admission: EM | Admit: 2018-06-24 | Discharge: 2018-06-24 | Disposition: A | Discharge status: Home / Self Care | Payer: Commercial Managed Care - PPO | Attending: Emergency Medicine | Admitting: Emergency Medicine

## 2018-06-24 DIAGNOSIS — M7918 Myalgia, other site: Secondary | ICD-10-CM | POA: Insufficient documentation

## 2018-06-24 DIAGNOSIS — R609 Edema, unspecified: Secondary | ICD-10-CM | POA: Insufficient documentation

## 2018-06-24 DIAGNOSIS — R0600 Dyspnea, unspecified: Secondary | ICD-10-CM

## 2018-06-24 DIAGNOSIS — R05 Cough: Secondary | ICD-10-CM | POA: Diagnosis not present

## 2018-06-24 DIAGNOSIS — R0602 Shortness of breath: Secondary | ICD-10-CM | POA: Diagnosis present

## 2018-06-24 DIAGNOSIS — J069 Acute upper respiratory infection, unspecified: Secondary | ICD-10-CM | POA: Diagnosis not present

## 2018-06-24 DIAGNOSIS — M069 Rheumatoid arthritis, unspecified: Secondary | ICD-10-CM | POA: Diagnosis not present

## 2018-06-24 DIAGNOSIS — F17210 Nicotine dependence, cigarettes, uncomplicated: Secondary | ICD-10-CM | POA: Insufficient documentation

## 2018-06-24 DIAGNOSIS — R197 Diarrhea, unspecified: Secondary | ICD-10-CM | POA: Insufficient documentation

## 2018-06-24 DIAGNOSIS — R0609 Other forms of dyspnea: Secondary | ICD-10-CM | POA: Diagnosis not present

## 2018-06-24 DIAGNOSIS — B9789 Other viral agents as the cause of diseases classified elsewhere: Secondary | ICD-10-CM

## 2018-06-24 MED ORDER — BENZONATATE 100 MG PO CAPS: 100.0000 mg | ORAL_CAPSULE | Freq: Three times a day (TID) | ORAL | 0 refills | Status: DC | PRN

## 2018-06-24 MED ORDER — ALBUTEROL SULFATE HFA 108 (90 BASE) MCG/ACT IN AERS
2.0000 | INHALATION_SPRAY | Freq: Once | RESPIRATORY_TRACT | Status: AC
  Administered 2018-06-24: 2 via RESPIRATORY_TRACT
  Filled 2018-06-24: qty 6.7

## 2018-06-24 NOTE — ED Notes (Signed)
Patient verbalizes understanding of discharge instructions. Opportunity for questioning and answers were provided. Armband removed by staff, pt discharged from ED.  

## 2018-06-24 NOTE — ED Provider Notes (Signed)
[facility] EMERGENCY DEPARTMENT Provider Note   CSN: [id number] Arrival date & time: 06/24/18  1350    History   Chief Complaint Chief Complaint  Patient presents with  . Shortness of Breath  . Diarrhea    HPI Michele Herrera is a 43 y.o. female.     The history is provided by the patient and medical records. No language interpreter was used.  Shortness of Breath  Diarrhea   Michele Herrera is a 43 y.o. female who presents to the Emergency Department complaining of sob/cough. She presents to the emergency department complaining of shortness of breath and cough that began yesterday. She has associated body aches and dyspnea on exertion. She denies any fevers. Today diarrhea began. She denies any hematochezia, melena, nausea, vomiting. She does have tightness in her chest. Her cough is dry nonproductive. She has experienced similar symptoms in the past about a month ago and was treated for bronchitis. She does have mild bilateral lower extremity edema for the last three days. She has a history of rheumatoid arthritis and she is a smoker. She does complain of a chronic cough related to her smoking. She does not have a history of COPD or asthma. She has no known sick contacts but does work around a lot of people. She saw her OB/GYN two weeks ago and had a blood draw performed at that time that she states that did not show anemia. She was evaluated for menorrhagia at that time. She has not experienced any vaginally thing since then. Past Medical History:  Diagnosis Date  . Arthritis   . Bronchitis   . GERD (gastroesophageal reflux disease)   . Pneumonia   . Seasonal allergies     Patient Active Problem List   Diagnosis Date Noted  . LUMBAR RADICULOPATHY 11/13/2008  . BREAST MASS, LEFT 02/03/2008    Past Surgical History:  Procedure Laterality Date  . TEMPOROMANDIBULAR JOINT SURGERY    . TMJ surgeries    . TONSILLECTOMY    . TUBAL LIGATION       OB  History   No obstetric history on file.      Home Medications    Prior to Admission medications   Medication Sig Start Date End Date Taking? Authorizing Provider  benzonatate (TESSALON) 100 MG capsule Take 1 capsule (100 mg total) by mouth 3 (three) times daily as needed for cough. 06/24/18   Tilden Fossa, MD  cetirizine (ZYRTEC) 10 MG tablet Take 10 mg by mouth daily.  08/02/11  [provider]    Family History Family History  Problem Relation Age of Onset  . Cancer Mother   . Hypertension Mother   . Hypertension Father   . Cancer Maternal Grandmother   . Cancer Maternal Grandfather   . Hypertension Maternal Grandfather   . Cancer Paternal Grandmother   . Hypertension Paternal Grandmother   . Heart disease Paternal Grandmother   . Cancer Paternal Grandfather   . Coronary artery disease Other   . Cancer Other   . Arthritis Other     Social History Social History   Tobacco Use  . Smoking status: Current Every Day Smoker    Packs/day: 1.00  . Smokeless tobacco: Never Used  Substance Use Topics  . Alcohol use: Yes    Comment: ocassional  . Drug use: No     Allergies   Patient has no known allergies.   Review of Systems Review of Systems  Respiratory: Positive for shortness  of breath.   Gastrointestinal: Positive for diarrhea.  All other systems reviewed and are negative.    Physical Exam Updated Vital Signs BP 136/80 (BP Location: Right Arm)   Pulse 83   Temp 98.4 F (36.9 C) (Oral)   Resp 18   Ht 5\' 4"  (1.626 m)   Wt 84.8 kg   LMP 06/04/2018   SpO2 100%   BMI 32.10 kg/m   Physical Exam Vitals signs and nursing note reviewed.  Constitutional:      Appearance: She is well-developed.  HENT:     Head: Normocephalic and atraumatic.  Cardiovascular:     Rate and Rhythm: Normal rate and regular rhythm.     Heart sounds: No murmur.  Pulmonary:     Effort: Pulmonary effort is normal. No respiratory distress.     Breath sounds: Normal  breath sounds.  Abdominal:     Palpations: Abdomen is soft.     Tenderness: There is no abdominal tenderness. There is no guarding or rebound.  Musculoskeletal:        General: No tenderness.     Comments: Trace bilateral lower extremity edema  Skin:    General: Skin is warm and dry.  Neurological:     Mental Status: She is alert and oriented to person, place, and time.  Psychiatric:        Mood and Affect: Mood normal.        Behavior: Behavior normal.      ED Treatments / Results  Labs (all labs ordered are listed, but only abnormal results are displayed) Labs Reviewed - No data to display  EKG None  Radiology Dg Chest [facility] 1 View  Result Date: 06/24/2018 CLINICAL DATA:  Shortness of breath, cough EXAM: PORTABLE CHEST 1 VIEW COMPARISON:  08/05/2011 FINDINGS: Heart and mediastinal contours are within normal limits. No focal opacities or effusions. No acute bony abnormality. IMPRESSION: No active disease. Electronically Signed   By: Charlett Nose M.D.   On: 06/24/2018 15:28    Procedures Procedures (including critical care time)  Medications Ordered in ED Medications  albuterol (PROVENTIL HFA;VENTOLIN HFA) 108 (90 Base) MCG/ACT inhaler 2 puff (2 puffs Inhalation Given 06/24/18 1435)     Initial Impression / Assessment and Plan / ED Course  I have reviewed the triage vital signs and the nursing notes.  Pertinent labs & imaging results that were available during my care of the patient were reviewed by me and considered in my medical decision making (see chart for details).        Patient presents to the emergency department for evaluation of shortness of breath and cough. She is non-toxic appearing on evaluation with no respiratory distress. Current presentation is not consistent with pneumonia, CHF, PE. Patient recently had outpatient labs that were negative for anemia per patient. She is feeling improved after albuterol administration. Discussed with patient home care  for viral URI. Discussed possible COVID infection.  Pt does not meet testing criteria at this time.  Counseled pt on home care, outpatient follow up and return precautions.   Michele Herrera was evaluated in Emergency Department on 06/24/2018 for the symptoms described in the history of present illness. She was evaluated in the context of the global COVID-19 pandemic, which necessitated consideration that the patient might be at risk for infection with the SARS-CoV-2 virus that causes COVID-19. Institutional protocols and algorithms that pertain to the evaluation of patients at risk for COVID-19 are in a state of rapid change based  on information released by regulatory bodies including the CDC and federal and state organizations. These policies and algorithms were followed during the patient's care in the ED.   Final Clinical Impressions(s) / ED Diagnoses   Final diagnoses:  Viral URI with cough  Diarrhea, unspecified type    ED Discharge Orders         Ordered    benzonatate (TESSALON) 100 MG capsule  3 times daily PRN     06/24/18 1539           Tilden Fossaees, Seiya Silsby, MD 06/24/18 1553

## 2018-06-24 NOTE — ED Triage Notes (Addendum)
Patient reports shortness of breath, non-productive cough, and generalized body aches (hx rheumatoid arthritis) onset yesterday. She states she recently had bronchitis end of February and feels like it could be the same thing, but she had gotten better after treated for it. She endorses completing COVID-19 e-visit and they suggested she be further evaluated in ED. Resp e/u, skin w/d. Patient adds new onset diarrhea that began today.

## 2018-06-24 NOTE — Progress Notes (Signed)
  E-Visit for Corona Virus Screening  Based on what you have shared with me, you need to seek an evaluation for a severe illness that is causing your symptoms which may be coronavirus or some other illness. I recommend that you be seen and evaluated "face to face". Our Emergency Departments are best equipped to handle patients with severe symptoms.   I recommend the following:  . If you are having a true medical emergency please call 911. . If you are considered high risk for Corona virus because of a known exposure, fever, shortness of breath and cough, OR if you have severe symptoms of any kind, seek medical care at an emergency room.  . Please call ahead and tell them that you were seen by telemedicine and they have recommended that you have a face to face evaluation. . [facility] Emergency Department [address], [place], [place] [address] [phone]  . [facility] [place] Emergency Department [address], [place], [place] [address] [phone]  . [facility] Emergency Department [address], [place], [place] [address] [phone]  . [facility] Emergency Department [address], [place], [place] [address] [phone]  . [facility] Emergency Department [address], [place], [place] [address] [phone]  NOTE: If you entered your credit card information for this eVisit, you will not be charged. You may see a "hold" on your card for the $35 but that hold will drop off and you will not have a charge processed.   Your e-visit answers were reviewed by a board certified advanced clinical practitioner to complete your personal care plan.  Thank you for using e-Visits.  

## 2018-06-24 NOTE — Discharge Instructions (Signed)
You can use the inhaler, to puffs every four hours as needed for wheezing. Get rechecked immediately if you develop severe worsening of your symptoms.   You need to isolate at home for at least seven days after the onset of your symptoms and three days after you last have a fever.     Individuals who are confirmed to have, or are being evaluated for, COVID-19 should follow the prevention steps below until a healthcare provider or local or state health department says they can return to normal activities.  Stay home except to get medical care You should restrict activities outside your home, except for getting medical care. Do not go to work, school, or public areas, and do not use public transportation or taxis.  Call ahead before visiting your doctor Before your medical appointment, call the healthcare provider and tell them that you have, or are being evaluated for, COVID-19 infection. This will help the healthcare providers office take steps to keep other people from getting infected. Ask your healthcare provider to call the local or state health department.  Monitor your symptoms Seek prompt medical attention if your illness is worsening (e.g., difficulty breathing). Before going to your medical appointment, call the healthcare provider and tell them that you have, or are being evaluated for, COVID-19 infection. Ask your healthcare provider to call the local or state health department.  Wear a facemask You should wear a facemask that covers your nose and mouth when you are in the same room with other people and when you visit a healthcare provider. People who live with or visit you should also wear a facemask while they are in the same room with you.  Separate yourself from other people in your home As much as possible, you should stay in a different room from other people in your home. Also, you should use a separate bathroom, if available.  Avoid sharing household items You  should not share dishes, drinking glasses, cups, eating utensils, towels, bedding, or other items with other people in your home. After using these items, you should wash them thoroughly with soap and water.  Cover your coughs and sneezes Cover your mouth and nose with a tissue when you cough or sneeze, or you can cough or sneeze into your sleeve. Throw used tissues in a lined trash can, and immediately wash your hands with soap and water for at least 20 seconds or use an alcohol-based hand rub.  Wash your [employer]hands Wash your hands often and thoroughly with soap and water for at least 20 seconds. You can use an alcohol-based hand sanitizer if soap and water are not available and if your hands are not visibly dirty. Avoid touching your eyes, nose, and mouth with unwashed hands.   Prevention Steps for Caregivers and Household Members of Individuals Confirmed to have, or Being Evaluated for, COVID-19 Infection Being Cared for in the Home  If you live with, or provide care at home for, a person confirmed to have, or being evaluated for, COVID-19 infection please follow these guidelines to prevent infection:  Follow healthcare providers instructions Make sure that you understand and can help the patient follow any healthcare provider instructions for all care.  Provide for the patients basic needs You should help the patient with basic needs in the home and provide support for getting groceries, prescriptions, and other personal needs.  Monitor the patients symptoms If they are getting sicker, call his or her medical provider and tell them that the patient  has, or is being evaluated for, COVID-19 infection. This will help the healthcare providers office take steps to keep other people from getting infected. Ask the healthcare provider to call the local or state health department.  Limit the number of people who have contact with the patient If possible, have only one caregiver for the  patient. Other household members should stay in another home or place of residence. If this is not possible, they should stay in another room, or be separated from the patient as much as possible. Use a separate bathroom, if available. Restrict visitors who do not have an essential need to be in the home.  Keep older adults, very young children, and other sick people away from the patient Keep older adults, very young children, and those who have compromised immune systems or chronic health conditions away from the patient. This includes people with chronic heart, lung, or kidney conditions, diabetes, and cancer.  Ensure good ventilation Make sure that shared spaces in the home have good air flow, such as from an air conditioner or an opened window, weather permitting.  Wash your hands often Wash your hands often and thoroughly with soap and water for at least 20 seconds. You can use an alcohol based hand sanitizer if soap and water are not available and if your hands are not visibly dirty. Avoid touching your eyes, nose, and mouth with unwashed hands. Use disposable paper towels to dry your hands. If not available, use dedicated cloth towels and replace them when they become wet.  Wear a facemask and gloves Wear a disposable facemask at all times in the room and gloves when you touch or have contact with the patients blood, body fluids, and/or secretions or excretions, such as sweat, saliva, sputum, nasal mucus, vomit, urine, or feces.  Ensure the mask fits over your nose and mouth tightly, and do not touch it during use. Throw out disposable facemasks and gloves after using them. Do not reuse. Wash your hands immediately after removing your facemask and gloves. If your personal clothing becomes contaminated, carefully remove clothing and launder. Wash your hands after handling contaminated clothing. Place all used disposable facemasks, gloves, and other waste in a lined container before  disposing them with other household waste. Remove gloves and wash your hands immediately after handling these items.  Do not share dishes, glasses, or other household items with the patient Avoid sharing household items. You should not share dishes, drinking glasses, cups, eating utensils, towels, bedding, or other items with a patient who is confirmed to have, or being evaluated for, COVID-19 infection. After the person uses these items, you should wash them thoroughly with soap and water.  Wash laundry thoroughly Immediately remove and wash clothes or bedding that have blood, body fluids, and/or secretions or excretions, such as sweat, saliva, sputum, nasal mucus, vomit, urine, or feces, on them. Wear gloves when handling laundry from the patient. Read and follow directions on labels of laundry or clothing items and detergent. In general, wash and dry with the warmest temperatures recommended on the label.  Clean all areas the individual has used often Clean all touchable surfaces, such as counters, tabletops, doorknobs, bathroom fixtures, toilets, phones, keyboards, tablets, and bedside tables, every day. Also, clean any surfaces that may have blood, body fluids, and/or secretions or excretions on them. Wear gloves when cleaning surfaces the patient has come in contact with. Use a diluted bleach solution (e.g., dilute bleach with 1 part bleach and 10 parts water) or  a household disinfectant with a label that says EPA-registered for coronaviruses. To make a bleach solution at home, add 1 tablespoon of bleach to 1 quart (4 cups) of water. For a larger supply, add  cup of bleach to 1 gallon (16 cups) of water. Read labels of cleaning products and follow recommendations provided on product labels. Labels contain instructions for safe and effective use of the cleaning product including precautions you should take when applying the product, such as wearing gloves or eye protection and making sure you  have good ventilation during use of the product. Remove gloves and wash hands immediately after cleaning.  Monitor yourself for signs and symptoms of illness Caregivers and household members are considered close contacts, should monitor their health, and will be asked to limit movement outside of the home to the extent possible. Follow the monitoring steps for close contacts listed on the symptom monitoring form.   ? If you have additional questions, contact your local health department or call the epidemiologist on call at [phone] (available 24/7). ? This guidance is subject to change. For the most up-to-date guidance from [facility], please refer to their website: [email or web address]

## 2018-10-05 ENCOUNTER — Encounter (HOSPITAL_COMMUNITY): Payer: Self-pay

## 2018-10-05 ENCOUNTER — Emergency Department (HOSPITAL_COMMUNITY)
Admission: EM | Admit: 2018-10-05 | Discharge: 2018-10-06 | Disposition: A | Discharge status: Home / Self Care | Payer: Commercial Managed Care - PPO | Attending: Emergency Medicine | Admitting: Emergency Medicine

## 2018-10-05 DIAGNOSIS — F17210 Nicotine dependence, cigarettes, uncomplicated: Secondary | ICD-10-CM | POA: Insufficient documentation

## 2018-10-05 DIAGNOSIS — R1032 Left lower quadrant pain: Secondary | ICD-10-CM | POA: Diagnosis present

## 2018-10-05 DIAGNOSIS — R350 Frequency of micturition: Secondary | ICD-10-CM | POA: Diagnosis not present

## 2018-10-05 DIAGNOSIS — N3001 Acute cystitis with hematuria: Secondary | ICD-10-CM | POA: Insufficient documentation

## 2018-10-05 DIAGNOSIS — R110 Nausea: Secondary | ICD-10-CM | POA: Insufficient documentation

## 2018-10-05 LAB — URINALYSIS, ROUTINE W REFLEX MICROSCOPIC
Bilirubin Urine: NEGATIVE
Glucose, UA: NEGATIVE mg/dL
Ketones, ur: NEGATIVE mg/dL
Nitrite: NEGATIVE
Protein, ur: NEGATIVE mg/dL
Specific Gravity, Urine: 1.017 (ref 1.005–1.030)
pH: 5 (ref 5.0–8.0)

## 2018-10-05 LAB — I-STAT BETA HCG BLOOD, ED (MC, WL, AP ONLY): I-stat hCG, quantitative: <5 m[IU]/mL (ref <5)

## 2018-10-05 NOTE — ED Triage Notes (Signed)
Pt states that she has been having L sided flank pain for the past two weeks, over the past two days the pain is constant, denies dysuria, reports dark colored urine.

## 2018-10-06 LAB — URINE CULTURE: Culture: 60000 — AB

## 2018-10-06 MED ORDER — CEPHALEXIN 500 MG PO CAPS: 500.0000 mg | ORAL_CAPSULE | Freq: Four times a day (QID) | ORAL | 0 refills | Status: AC

## 2018-10-06 NOTE — Discharge Instructions (Signed)
Thank you for allowing me to care for you today in the Emergency Department.   Take 1 tablet of Keflex by mouth every 6 hours for the next 5 days.  You can take Tylenol and ibuprofen as directed on the label at home for pain.  Please follow-up with primary care to ensure that the blood in your urine improves after you have finished the course of antibiotics.  Return to the emergency department if you develop worsening pain in your side with persistent vomiting, fevers, severe abdominal pain, if you stop making urine, or other new, concerning symptoms.

## 2018-10-06 NOTE — ED Notes (Signed)
The pt is c/o lt flank pain for 2 days no nausea or vomiting no bloody urine no frequency

## 2018-10-06 NOTE — ED Provider Notes (Signed)
[facility] EMERGENCY DEPARTMENT Provider Note   CSN: [id number] Arrival date & time: 10/05/18  2025    History   Chief Complaint Chief Complaint  Patient presents with  . Flank Pain    HPI Michele Herrera is a 43 y.o. female with history of GERD, bronchitis, and arthritis who presents to the emergency department with a chief complaint of left flank pain.  The patient endorses left flank pain for the last 2 weeks that has been intermittent, but became constant 2 days ago.  She reports the pain became severe and sharp around noon.  She took Tylenol for her symptoms.  She reports later in the day that the pain became persistently dull and achy.  She reports associated urinary frequency, urinary hesitancy and pressure.  She also notes that she became more nauseated when the pain became more intense and sharp.  She reports that her menstrual cycle ended yesterday, but reports that she has a chronic history of hematuria.  She does note that her urine has been more dark-colored over the last 2 days.  She denies abdominal pain, diarrhea, vomiting, vaginal discharge or pain, dyspareunia, shortness of breath, cough, chest pain, fever, or chills.  No treatment prior to arrival.  She is a current, every day smoker.  She reports a history of frequent UTIs when she was younger, but reports that she has not had a urinary tract infection in several years.  She reports she is unsure if she has had any kidney stones.  She states that she thinks she was diagnosed with one several years ago.     The history is provided by the patient. No language interpreter was used.    Past Medical History:  Diagnosis Date  . Arthritis   . Bronchitis   . GERD (gastroesophageal reflux disease)   . Pneumonia   . Seasonal allergies     Patient Active Problem List   Diagnosis Date Noted  . LUMBAR RADICULOPATHY 11/13/2008  . BREAST MASS, LEFT 02/03/2008    Past Surgical History:  Procedure  Laterality Date  . TEMPOROMANDIBULAR JOINT SURGERY    . TMJ surgeries    . TONSILLECTOMY    . TUBAL LIGATION       OB History   No obstetric history on file.      Home Medications    Prior to Admission medications   Medication Sig Start Date End Date Taking? Authorizing Provider  benzonatate (TESSALON) 100 MG capsule Take 1 capsule (100 mg total) by mouth 3 (three) times daily as needed for cough. 06/24/18   Quintella Reichert, MD  cephALEXin (KEFLEX) 500 MG capsule Take 1 capsule (500 mg total) by mouth 4 (four) times daily for 5 days. 10/06/18 10/11/18  , Laymond Purser, PA-C    Family History Family History  Problem Relation Age of Onset  . Cancer Mother   . Hypertension Mother   . Hypertension Father   . Cancer Maternal Grandmother   . Cancer Maternal Grandfather   . Hypertension Maternal Grandfather   . Cancer Paternal Grandmother   . Hypertension Paternal Grandmother   . Heart disease Paternal Grandmother   . Cancer Paternal Grandfather   . Coronary artery disease Other   . Cancer Other   . Arthritis Other     Social History Social History   Tobacco Use  . Smoking status: Current Every Day Smoker    Packs/day: 1.00  . Smokeless tobacco: Never Used  Substance Use Topics  .  Alcohol use: Yes    Comment: ocassional  . Drug use: No     Allergies   Patient has no known allergies.   Review of Systems Review of Systems  Constitutional: Negative for activity change, chills and fever.  HENT: Negative for congestion and sore throat.   Respiratory: Negative for cough, shortness of breath and wheezing.   Cardiovascular: Negative for chest pain.  Gastrointestinal: Negative for abdominal pain, diarrhea, nausea and vomiting.  Genitourinary: Positive for frequency, hematuria and urgency. Negative for dysuria, flank pain, vaginal discharge and vaginal pain.  Musculoskeletal: Negative for back pain, myalgias, neck pain and neck stiffness.  Skin: Negative for rash.   Allergic/Immunologic: Negative for immunocompromised state.  Neurological: Negative for syncope, weakness, numbness and headaches.  Psychiatric/Behavioral: Negative for confusion.     Physical Exam Updated Vital Signs BP 112/68   Pulse 71   Temp 98.1 F (36.7 C) (Oral)   Resp 16   SpO2 98%   Physical Exam Vitals signs and nursing note reviewed.  Constitutional:      General: She is not in acute distress.    Appearance: She is not ill-appearing, toxic-appearing or diaphoretic.  HENT:     Head: Normocephalic.  Eyes:     Conjunctiva/sclera: Conjunctivae normal.  Neck:     Musculoskeletal: Neck supple.  Cardiovascular:     Rate and Rhythm: Normal rate and regular rhythm.     Heart sounds: No murmur. No friction rub. No gallop.   Pulmonary:     Effort: Pulmonary effort is normal. No respiratory distress.     Breath sounds: No stridor. No wheezing, rhonchi or rales.  Chest:     Chest wall: No tenderness.  Abdominal:     General: There is no distension.     Palpations: Abdomen is soft. There is no mass.     Tenderness: There is no abdominal tenderness. There is left CVA tenderness. There is no right CVA tenderness, guarding or rebound.     Hernia: No hernia is present.     Comments: Tender to palpation of the left CVA region.  No right CVA tenderness.  Abdomen soft, non-tender, nondistended.  Normoactive bowel sounds.  Skin:    General: Skin is warm.     Findings: No rash.  Neurological:     Mental Status: She is alert.  Psychiatric:        Behavior: Behavior normal.      ED Treatments / Results  Labs (all labs ordered are listed, but only abnormal results are displayed) Labs Reviewed  URINALYSIS, ROUTINE W REFLEX MICROSCOPIC - Abnormal; Notable for the following components:      Result Value   APPearance HAZY (*)    Hgb urine dipstick LARGE (*)    Leukocytes,Ua SMALL (*)    Bacteria, UA RARE (*)    All other components within normal limits  URINE CULTURE   I-STAT BETA HCG BLOOD, ED ([facility], [facility], [facility] ONLY)    EKG None  Radiology No results found.  Procedures Procedures (including critical care time)  Medications Ordered in ED Medications - No data to display   Initial Impression / Assessment and Plan / ED Course  I have reviewed the triage vital signs and the nursing notes.  Pertinent labs & imaging results that were available during my care of the patient were reviewed by me and considered in my medical decision making (see chart for details).        43 year old female with history of GERD,  arthritis, bronchitis presenting with left flank pain and urinary symptoms for last 2 days.  No constitutional symptoms.  She is hemodynamically stable.  UA with hematuria and hemoglobinuria as well as leukocyte esterase.  Shared decision making conversation with the patient regarding imaging to further assess for kidney stone.  Patient declines imaging at this time after conversation she feels that her symptoms are more infectious.  She is not septic I have a low suspicion for obstructive uropathy.  Will discharge with Keflex for UTI.  Urine culture is pending.  Advised the patient to follow-up with primary care to ensure that hematuria resolves.  She is hemodynamically stable and in no acute distress.  Return precautions to the ER given.  Safe for discharge home with outpatient follow-up.  Final Clinical Impressions(s) / ED Diagnoses   Final diagnoses:  Acute cystitis with hematuria    ED Discharge Orders         Ordered    cephALEXin (KEFLEX) 500 MG capsule  4 times daily     10/06/18 0339           Frederik Pear A, PA-C 10/06/18 0359    Nira Conn, MD 10/07/18 2019

## 2018-10-07 ENCOUNTER — Telehealth: Payer: Self-pay | Admitting: *Deleted

## 2018-10-07 NOTE — Telephone Encounter (Signed)
Post ED Visit - Positive Culture Follow-up  Culture report reviewed by antimicrobial stewardship pharmacist: [place] Team []  Elenor Quinones, Pharm.D. []  Heide Guile, Pharm.D., BCPS AQ-ID []  Parks Neptune, Pharm.D., BCPS []  Alycia Rossetti, Pharm.D., BCPS []  [place], Pharm.D., BCPS, AAHIVP []  Legrand Como, Pharm.D., BCPS, AAHIVP []  Salome Arnt, PharmD, BCPS []  Johnnette Gourd, PharmD, BCPS []  Hughes Better, PharmD, BCPS []  Leeroy Cha, PharmD []  Laqueta Linden, PharmD, BCPS []  Albertina Parr, PharmD Elicia Lamp, [place] Team []  Leodis Sias, PharmD []  Lindell Spar, PharmD []  Royetta Asal, PharmD []  Graylin Shiver, Rph []  Rema Fendt) Glennon Mac, PharmD []  Arlyn Dunning, PharmD []  Netta Cedars, PharmD []  Dia Sitter, PharmD []  Leone Haven, PharmD []  Gretta Arab, PharmD []  Theodis Shove, PharmD []  Peggyann Juba, PharmD []  Reuel Boom, PharmD   Positive urine culture Treated with cephalexin organism sensitive to the same and no further patient follow-up is required at this time.  Harlon Flor [facility] 10/07/2018, 11:25 AM

## 2018-12-06 ENCOUNTER — Emergency Department (HOSPITAL_COMMUNITY)
Admission: EM | Admit: 2018-12-06 | Discharge: 2018-12-06 | Disposition: A | Discharge status: Home / Self Care | Payer: Commercial Managed Care - PPO | Attending: Emergency Medicine | Admitting: Emergency Medicine

## 2018-12-06 ENCOUNTER — Other Ambulatory Visit: Payer: Self-pay

## 2018-12-06 ENCOUNTER — Telehealth: Payer: Commercial Managed Care - PPO | Admitting: Physician Assistant

## 2018-12-06 ENCOUNTER — Encounter (HOSPITAL_COMMUNITY): Payer: Self-pay | Admitting: Emergency Medicine

## 2018-12-06 ENCOUNTER — Emergency Department (HOSPITAL_COMMUNITY): Payer: Commercial Managed Care - PPO

## 2018-12-06 DIAGNOSIS — Z20828 Contact with and (suspected) exposure to other viral communicable diseases: Secondary | ICD-10-CM | POA: Insufficient documentation

## 2018-12-06 DIAGNOSIS — H538 Other visual disturbances: Secondary | ICD-10-CM

## 2018-12-06 DIAGNOSIS — B349 Viral infection, unspecified: Secondary | ICD-10-CM | POA: Insufficient documentation

## 2018-12-06 DIAGNOSIS — F17200 Nicotine dependence, unspecified, uncomplicated: Secondary | ICD-10-CM | POA: Diagnosis not present

## 2018-12-06 DIAGNOSIS — R05 Cough: Secondary | ICD-10-CM

## 2018-12-06 DIAGNOSIS — R0789 Other chest pain: Secondary | ICD-10-CM | POA: Diagnosis not present

## 2018-12-06 DIAGNOSIS — Z20822 Contact with and (suspected) exposure to covid-19: Secondary | ICD-10-CM

## 2018-12-06 DIAGNOSIS — R059 Cough, unspecified: Secondary | ICD-10-CM

## 2018-12-06 LAB — COMPREHENSIVE METABOLIC PANEL
ALT: 20 U/L (ref 0–44)
AST: 16 U/L (ref 15–41)
Albumin: 3.7 g/dL (ref 3.5–5.0)
Alkaline Phosphatase: 33 U/L — ABNORMAL LOW (ref 38–126)
Anion gap: 11 (ref 5–15)
BUN: 12 mg/dL (ref 6–20)
CO2: 25 mmol/L (ref 22–32)
Calcium: 9.5 mg/dL (ref 8.9–10.3)
Chloride: 103 mmol/L (ref 98–111)
Creatinine, Ser: 0.91 mg/dL (ref 0.44–1.00)
GFR calc Af Amer: >60 mL/min (ref >60)
GFR calc non Af Amer: >60 mL/min (ref >60)
Glucose, Bld: 102 mg/dL — ABNORMAL HIGH (ref 70–99)
Potassium: 4.9 mmol/L (ref 3.5–5.1)
Sodium: 139 mmol/L (ref 135–145)
Total Bilirubin: 0.7 mg/dL (ref 0.3–1.2)
Total Protein: 6.8 g/dL (ref 6.5–8.1)

## 2018-12-06 LAB — CBC
HCT: 42.3 % (ref 36.0–46.0)
Hemoglobin: 14.4 g/dL (ref 12.0–15.0)
MCH: 33.6 pg (ref 26.0–34.0)
MCHC: 34 g/dL (ref 30.0–36.0)
MCV: 98.6 fL (ref 80.0–100.0)
Platelets: 342 10*3/uL (ref 150–400)
RBC: 4.29 MIL/uL (ref 3.87–5.11)
RDW: 11.8 % (ref 11.5–15.5)
WBC: 12.9 10*3/uL — ABNORMAL HIGH (ref 4.0–10.5)
nRBC: 0 % (ref 0.0–0.2)

## 2018-12-06 LAB — TROPONIN I (HIGH SENSITIVITY): Troponin I (High Sensitivity): <2 ng/L (ref <18)

## 2018-12-06 NOTE — Discharge Instructions (Addendum)
You were evaluated in the Emergency Department and after careful evaluation, we did not find any emergent condition requiring admission or further testing in the hospital.  Your exam/testing today is overall reassuring.  Your testing today does not reveal any emergencies or heart damage.  As discussed, please practice home quarantine until you receive your coronavirus test result.  If positive, please continue home quarantine per CDC recommendations.  Please return to the Emergency Department if you experience any worsening of your condition.  We encourage you to follow up with a primary care provider.  Thank you for allowing [place] to be a part of your care.

## 2018-12-06 NOTE — Progress Notes (Signed)
  E-Visit for [employer] Virus Screening  Based on what you have shared with me, you need to seek an evaluation for a severe illness that is causing your symptoms which may be coronavirus or some other illness. I recommend that you be seen and evaluated "face to face". Our Emergency Departments are best equipped to handle patients with severe symptoms.  You will be evaluated by the ER provider (or higher level of care provider) who will determine whether you need formal testing.   I recommend the following:  . If you are having a true medical emergency please call 911. . If you are considered high risk for Corona virus because of a known exposure, fever, shortness of breath and cough, OR if you have severe symptoms of any kind, seek medical care at an emergency room.   . [facility] Emergency Department [place], [place], [place] [address] [phone]  . [facility] Emergency Department [place], [place], [place] [address] [phone]  . [facility] Emergency Department [place], [place], [place] [address] [phone]  . [facility] Emergency Department [address] [place], [place], [place] [address] [phone]  . [facility] Emergency Department [place], [place], [place] [address] [phone]  NOTE: If you entered your credit card information for this eVisit, you will not be charged. You may see a "hold" on your card for the $35 but that hold will drop off and you will not have a charge processed.   Your e-visit answers were reviewed by a board certified advanced clinical practitioner to complete your personal care plan.  Thank you for using e-Visits.  Greater than 5 minutes, yet less than 10 minutes of time have been spent researching, coordinating, and implementing care for this patient today.

## 2018-12-06 NOTE — ED Triage Notes (Addendum)
Recent exposure to Covid + person last week. in with c/o congestion since Friday. States it has settled into her chest and she is frequently dry coughing.  Feels malaise, no fevers. Her doc wanted her to be evaluated for changes in bilateral vision since 1200 noon yesterday. No neuro changes, also c/o heart fluttering.

## 2018-12-06 NOTE — ED Provider Notes (Signed)
[facility] Emergency Department Provider Note MRN:  [id number]  Arrival date & time: 12/06/18     Chief Complaint   Cough, Nasal Congestion, and vision changes   History of Present Illness   Michele Herrera is a 43 y.o. year-old female with a history of GERD presenting to the ED with chief complaint of cough.  Patient has been experiencing cold-like symptoms for the past 2 or 3 days.  She had a virtual visit with a provider today.  She had mentioned that while reading for the past 2 days she has had occasional blurry vision.  Provider felt that she should be evaluated in person in the emergency department.  She explains that the blurred vision is mild, lasts only a few moments, and is relieved by blinking her eyes.  Endorsing some watery eyes.  Having continued nasal congestion, cough.  Denies shortness of breath.  Endorsing some central chest discomfort, dull, worse with cough.  Also endorsing some pain radiating up to the left side of the neck.  Was exposed to a friend who had coronavirus.  Review of Systems  A complete 10 system review of systems was obtained and all systems are negative except as noted in the HPI and PMH.   Patient's Health History    Past Medical History:  Diagnosis Date  . Arthritis   . Bronchitis   . GERD (gastroesophageal reflux disease)   . Pneumonia   . Seasonal allergies     Past Surgical History:  Procedure Laterality Date  . TEMPOROMANDIBULAR JOINT SURGERY    . TMJ surgeries    . TONSILLECTOMY    . TUBAL LIGATION      Family History  Problem Relation Age of Onset  . Cancer Mother   . Hypertension Mother   . Hypertension Father   . Cancer Maternal Grandmother   . Cancer Maternal Grandfather   . Hypertension Maternal Grandfather   . Cancer Paternal Grandmother   . Hypertension Paternal Grandmother   . Heart disease Paternal Grandmother   . Cancer Paternal Grandfather   . Coronary artery disease Other   . Cancer  Other   . Arthritis Other     Social History   Socioeconomic History  . Marital status: Divorced    Spouse name: Not on file  . Number of children: Not on file  . Years of education: Not on file  . Highest education level: Not on file  Occupational History  . Not on file  Social Needs  . Financial resource strain: Not on file  . Food insecurity    Worry: Not on file    Inability: Not on file  . Transportation needs    Medical: Not on file    Non-medical: Not on file  Tobacco Use  . Smoking status: Current Every Day Smoker    Packs/day: 1.00  . Smokeless tobacco: Never Used  Substance and Sexual Activity  . Alcohol use: Yes    Comment: ocassional  . Drug use: No  . Sexual activity: Yes    Birth control/protection: None  Lifestyle  . Physical activity    Days per week: Not on file    Minutes per session: Not on file  . Stress: Not on file  Relationships  . Social Herbalist on phone: Not on file    Gets together: Not on file    Attends religious service: Not on file    Active member of club or organization: Not  on file    Attends meetings of clubs or organizations: Not on file    Relationship status: Not on file  . Intimate partner violence    Fear of current or ex partner: Not on file    Emotionally abused: Not on file    Physically abused: Not on file    Forced sexual activity: Not on file  Other Topics Concern  . Not on file  Social History Narrative  . Not on file     Physical Exam  Vital Signs and Nursing Notes reviewed Vitals:   12/06/18 0913  BP: 124/68  Pulse: 82  Resp: 16  Temp: 98.3 F (36.8 C)  SpO2: 98%    CONSTITUTIONAL: Well-appearing, NAD NEURO:  Alert and oriented x 3, no focal deficits EYES:  eyes equal and reactive ENT/NECK:  no LAD, no JVD CARDIO: Regular rate, well-perfused, normal S1 and S2 PULM:  CTAB no wheezing or rhonchi GI/GU:  normal bowel sounds, non-distended, non-tender MSK/SPINE:  No gross deformities, no  edema SKIN:  no rash, atraumatic PSYCH:  Appropriate speech and behavior  Diagnostic and Interventional Summary    EKG Interpretation  Date/Time:  Tuesday December 06 2018 12:36:24 EDT Ventricular Rate:  59 PR Interval:    QRS Duration: 87 QT Interval:  432 QTC Calculation: 428 R Axis:   23 Text Interpretation:  Sinus rhythm Low voltage, precordial leads Abnormal R-wave progression, early transition Baseline wander in lead(s) V6 Confirmed by Kennis CarinaBero, Sohana Austell [phone]([id number]) on 12/06/2018 1:19:34 PM      Labs Reviewed  CBC - Abnormal; Notable for the following components:      Result Value   WBC 12.9 (*)    All other components within normal limits  COMPREHENSIVE METABOLIC PANEL - Abnormal; Notable for the following components:   Glucose, Bld 102 (*)    Alkaline Phosphatase 33 (*)    All other components within normal limits  NOVEL CORONAVIRUS, NAA (HOSP ORDER, SEND-OUT TO REF LAB; TAT 18-24 HRS)  TROPONIN I (HIGH SENSITIVITY)  TROPONIN I (HIGH SENSITIVITY)    DG Chest Port 1 View  Final Result      Medications - No data to display   Procedures Critical Care  ED Course and Medical Decision Making  I have reviewed the triage vital signs and the nursing notes.  Pertinent labs & imaging results that were available during my care of the patient were reviewed by me and considered in my medical decision making (see below for details).  Patient is with normal visual acuity, normal pupillary response, normal extraocular movements, her intermittent blurred vision seems consistent with watery eyes.  She has no other neurological deficits, no flashing lights in the peripheral vision, no floaters, nothing to suggest retinal pathology.  In general she has symptoms consistent with viral illness, possibly coronavirus given her exposure.  She is also experiencing atypical chest pain with radiation to the neck, will screen with EKG and troponin.  No evidence of DVT, little to no concern for PE.   Testing is reassuring, high-sensitivity troponin is negative, appropriate for discharge.  Elmer SowMichael M. Pilar PlateBero, MD [facility] [email or web address]  Final Clinical Impressions(s) / ED Diagnoses     ICD-10-CM   1. Viral illness  B34.9   2. Cough  R05 DG [facility]    DG Chest Port 1 View  3. Close Exposure to Covid-19 Virus  Z20.828     ED Discharge Orders    None  Discharge Instructions Discussed with and Provided to Patient:   Discharge Instructions     You were evaluated in the Emergency Department and after careful evaluation, we did not find any emergent condition requiring admission or further testing in the hospital.  Your exam/testing today is overall reassuring.  Your testing today does not reveal any emergencies or heart damage.  As discussed, please practice home quarantine until you receive your coronavirus test result.  If positive, please continue home quarantine per CDC recommendations.  Please return to the Emergency Department if you experience any worsening of your condition.  We encourage you to follow up with a primary care provider.  Thank you for allowing [place] to be a part of your care.       Sabas Sous, MD 12/06/18 1321

## 2018-12-07 LAB — NOVEL CORONAVIRUS, NAA (HOSP ORDER, SEND-OUT TO REF LAB; TAT 18-24 HRS): SARS-CoV-2, NAA: NOT DETECTED

## 2021-02-05 ENCOUNTER — Ambulatory Visit: Payer: Commercial Managed Care - PPO | Admitting: Family Medicine

## 2021-05-01 ENCOUNTER — Ambulatory Visit: Payer: 59 | Admitting: Family Medicine

## 2021-07-10 ENCOUNTER — Ambulatory Visit (INDEPENDENT_AMBULATORY_CARE_PROVIDER_SITE_OTHER): Payer: 59 | Admitting: Family Medicine

## 2021-07-10 ENCOUNTER — Encounter: Payer: Self-pay | Admitting: Family Medicine

## 2021-07-10 VITALS — BP 114/71 | HR 71 | Ht 65.0 in | Wt 186.0 lb

## 2021-07-10 DIAGNOSIS — Z1211 Encounter for screening for malignant neoplasm of colon: Secondary | ICD-10-CM

## 2021-07-10 DIAGNOSIS — R5383 Other fatigue: Secondary | ICD-10-CM

## 2021-07-10 DIAGNOSIS — F17210 Nicotine dependence, cigarettes, uncomplicated: Secondary | ICD-10-CM

## 2021-07-10 DIAGNOSIS — M2550 Pain in unspecified joint: Secondary | ICD-10-CM | POA: Diagnosis not present

## 2021-07-10 DIAGNOSIS — G8929 Other chronic pain: Secondary | ICD-10-CM

## 2021-07-10 DIAGNOSIS — F17200 Nicotine dependence, unspecified, uncomplicated: Secondary | ICD-10-CM | POA: Insufficient documentation

## 2021-07-10 HISTORY — DX: Encounter for screening for malignant neoplasm of colon: Z12.11

## 2021-07-10 MED ORDER — CHANTIX STARTING MONTH PAK 0.5 MG X 11 & 1 MG X 42 PO TBPK: ORAL_TABLET | ORAL | 0 refills | Status: DC

## 2021-07-10 MED ORDER — MELOXICAM 7.5 MG PO TABS: 7.5000 mg | ORAL_TABLET | Freq: Every day | ORAL | 3 refills | Status: DC | PRN

## 2021-07-10 NOTE — Progress Notes (Signed)
?[name] - 46 y.o. female MRN [id number]  Date of birth: 1975/11/28 ? ?Subjective ?Chief Complaint  ?Patient presents with  ? Establish Care  ? ? ?HPI ?Michele Herrera is a 46 year old female here today for initial visit to establish care.  She has history of anxiety and nicotine dependence. ? ?Main concern today is chronic joint pain and fatigue.  Thought at 1 point she had arthritis, possibly RA.  She is unsure if she had lab testing at that time.  She has tried prescription strength ibuprofen and meloxicam in the past.  These were both fairly effective in controlling her joint pain.  She has not noted any joint swelling.  Pain is not having any one particular joint but moves around. ? ?She is interested in quitting smoking.  She has questions about trying Chantix.  She is interested in this but has heard that some people have severe side effects from this. ? ?She is due for colon cancer screening.  Prefers to have colonoscopy. ? ?ROS:  A comprehensive ROS was completed and negative except as noted per HPI ? ?No Known Allergies ? ?Past Medical History:  ?Diagnosis Date  ? Anxiety 2000  ? I do not know an exact date  ? Arthritis   ? Bronchitis   ? GERD (gastroesophageal reflux disease)   ? Pneumonia   ? Seasonal allergies   ? ? ?Past Surgical History:  ?Procedure Laterality Date  ? TEMPOROMANDIBULAR JOINT SURGERY    ? TMJ surgeries    ? TONSILLECTOMY    ? TUBAL LIGATION    ? ? ?Social History  ? ?Socioeconomic History  ? Marital status: Divorced  ?  Spouse name: Not on file  ? Number of children: Not on file  ? Years of education: Not on file  ? Highest education level: Not on file  ?Occupational History  ? Not on file  ?Tobacco Use  ? Smoking status: Every Day  ?  Packs/day: 0.50  ?  Years: 24.00  ?  Pack years: 12.00  ?  Types: Cigarettes  ?  Passive exposure: Never  ? Smokeless tobacco: Never  ?Vaping Use  ? Vaping Use: Never used  ?Substance and Sexual Activity  ? Alcohol use: Not Currently  ?  Comment:  ocassional  ? Drug use: No  ? Sexual activity: Yes  ?  Birth control/protection: None  ?Other Topics Concern  ? Not on file  ?Social History Narrative  ? Not on file  ? ?Social Determinants of Health  ? ?Financial Resource Strain: Not on file  ?Food Insecurity: Not on file  ?Transportation Needs: Not on file  ?Physical Activity: Not on file  ?Stress: Not on file  ?Social Connections: Not on file  ? ? ?Family History  ?Problem Relation Age of Onset  ? Cancer Mother   ? Hypertension Mother   ? Arthritis Mother   ? COPD Mother   ? Early death Mother   ? Hypertension Father   ? Cancer Maternal Grandmother   ? Cancer Maternal Grandfather   ? Hypertension Maternal Grandfather   ? Cancer Paternal Grandmother   ? Hypertension Paternal Grandmother   ? Heart disease Paternal Grandmother   ? Hearing loss Paternal Grandmother   ? Cancer Paternal Grandfather   ? Coronary artery disease Other   ? Cancer Other   ? Arthritis Other   ? ADD / ADHD Daughter   ? ? ?Health Maintenance  ?Topic Date Due  ? COVID-19 Vaccine (1) Never done  ?  HIV Screening  Never done  ? Hepatitis C Screening  Never done  ? TETANUS/TDAP  Never done  ? PAP SMEAR-Modifier  Never done  ? COLONOSCOPY (Pts 45-31yrs Insurance coverage will need to be confirmed)  Never done  ? INFLUENZA VACCINE  10/14/2021  ? HPV VACCINES  Aged Out  ? ? ? ?----------------------------------------------------------------------------------------------------------------------------------------------------------------------------------------------------------------- ?Physical Exam ?BP 114/71 (BP Location: Left Arm, Patient Position: Sitting, Cuff Size: Normal)   Pulse 71   Ht $R'5\' 5"'oP$  (1.651 m)   Wt 186 lb (84.4 kg)   SpO2 98%   BMI 30.95 kg/m?  ? ?Physical Exam ?Constitutional:   ?   Appearance: Normal appearance.  ?Eyes:  ?   General: No scleral icterus. ?Cardiovascular:  ?   Rate and Rhythm: Normal rate and regular rhythm.  ?Pulmonary:  ?   Effort: Pulmonary effort is normal.  ?    Breath sounds: Normal breath sounds.  ?Musculoskeletal:  ?   Cervical back: Neck supple.  ?   Comments: No joint swelling, tenderness or effusions noted today.  ?Neurological:  ?   General: No focal deficit present.  ?   Mental Status: She is alert.  ?Psychiatric:     ?   Mood and Affect: Mood normal.     ?   Behavior: Behavior normal.  ? ? ?------------------------------------------------------------------------------------------------------------------------------------------------------------------------------------------------------------------- ?Assessment and Plan ? ?Nicotine dependence ?Counseled on smoking cessation.  Discussed risk and benefits of Chantix.  She agrees that risk of continued smoking outweigh risk of Chantix.  Prescription for starter pack sent in. ? ?Colon cancer screening ?Referral placed for colonoscopy. ? ?Chronic joint pain ?She has had a good response to NSAIDs in the past.  Adding meloxicam back on to use as needed.  Checking labs today. ?Orders Placed This Encounter  ?Procedures  ? COMPLETE METABOLIC PANEL WITH GFR  ? CBC with Differential  ? TSH  ? B12  ? Vitamin D (25 hydroxy)  ? Sed Rate (ESR)  ? C-reactive protein  ? Uric acid  ? Ambulatory referral to Gastroenterology  ?  Referral Priority:   Routine  ?  Referral Type:   Consultation  ?  Referral Reason:   Specialty Services Required  ?  Number of Visits Requested:   1  ? ? ? ?Meds ordered this encounter  ?Medications  ? meloxicam (MOBIC) 7.5 MG tablet  ?  Sig: Take 1-2 tablets (7.5-15 mg total) by mouth daily as needed for pain.  ?  Dispense:  45 tablet  ?  Refill:  3  ? Varenicline Tartrate, Starter, (CHANTIX STARTING MONTH PAK) 0.5 MG X 11 & 1 MG X 42 TBPK  ?  Sig: Take as directed on packaging  ?  Dispense:  53 each  ?  Refill:  0  ? ? ?No follow-ups on file. ? ? ? ?This visit occurred during the SARS-CoV-2 public health emergency.  Safety protocols were in place, including screening questions prior to the visit, additional  usage of staff PPE, and extensive cleaning of exam room while observing appropriate contact time as indicated for disinfecting solutions.  ? ?

## 2021-07-10 NOTE — Patient Instructions (Addendum)
Great to meet you today! ?Try meloxicam for joint pain.  ?Do not take with ibuprofen.  ?We'll be in touch with lab results.  ?

## 2021-07-10 NOTE — Assessment & Plan Note (Signed)
Referral placed for colonoscopy. 

## 2021-07-10 NOTE — Assessment & Plan Note (Signed)
She has had a good response to NSAIDs in the past.  Adding meloxicam back on to use as needed.  Checking labs today. ?Orders Placed This Encounter  ?Procedures  ?? COMPLETE METABOLIC PANEL WITH GFR  ?? CBC with Differential  ?? TSH  ?? B12  ?? Vitamin D (25 hydroxy)  ?? Sed Rate (ESR)  ?? C-reactive protein  ?? Uric acid  ?? Ambulatory referral to Gastroenterology  ?  Referral Priority:   Routine  ?  Referral Type:   Consultation  ?  Referral Reason:   Specialty Services Required  ?  Number of Visits Requested:   1  ? ? ?

## 2021-07-10 NOTE — Assessment & Plan Note (Signed)
Counseled on smoking cessation.  Discussed risk and benefits of Chantix.  She agrees that risk of continued smoking outweigh risk of Chantix.  Prescription for starter pack sent in. ?

## 2021-07-11 LAB — COMPLETE METABOLIC PANEL WITH GFR
AG Ratio: 1.7 (calc) (ref 1.0–2.5)
ALT: 15 U/L (ref 6–29)
AST: 13 U/L (ref 10–35)
Albumin: 4.3 g/dL (ref 3.6–5.1)
Alkaline phosphatase (APISO): 30 U/L — ABNORMAL LOW (ref 31–125)
BUN: 13 mg/dL (ref 7–25)
CO2: 29 mmol/L (ref 20–32)
Calcium: 9.9 mg/dL (ref 8.6–10.2)
Chloride: 105 mmol/L (ref 98–110)
Creat: 0.81 mg/dL (ref 0.50–0.99)
Globulin: 2.6 g/dL (calc) (ref 1.9–3.7)
Glucose, Bld: 94 mg/dL (ref 65–99)
Potassium: 4.3 mmol/L (ref 3.5–5.3)
Sodium: 141 mmol/L (ref 135–146)
Total Bilirubin: 0.7 mg/dL (ref 0.2–1.2)
Total Protein: 6.9 g/dL (ref 6.1–8.1)
eGFR: 91 mL/min/{1.73_m2} (ref >=60)

## 2021-07-11 LAB — CBC WITH DIFFERENTIAL/PLATELET
Absolute Monocytes: 620 cells/uL (ref 200–950)
Basophils Absolute: 47 cells/uL (ref 0–200)
Basophils Relative: 0.4 %
Eosinophils Absolute: 164 cells/uL (ref 15–500)
Eosinophils Relative: 1.4 %
HCT: 38.7 % (ref 35.0–45.0)
Hemoglobin: 13.3 g/dL (ref 11.7–15.5)
Lymphs Abs: 4259 cells/uL — ABNORMAL HIGH (ref 850–3900)
MCH: 34 pg — ABNORMAL HIGH (ref 27.0–33.0)
MCHC: 34.4 g/dL (ref 32.0–36.0)
MCV: 99 fL (ref 80.0–100.0)
MPV: 9.5 fL (ref 7.5–12.5)
Monocytes Relative: 5.3 %
Neutro Abs: 6611 cells/uL (ref 1500–7800)
Neutrophils Relative %: 56.5 %
Platelets: 372 10*3/uL (ref 140–400)
RBC: 3.91 10*6/uL (ref 3.80–5.10)
RDW: 11.6 % (ref 11.0–15.0)
Total Lymphocyte: 36.4 %
WBC: 11.7 10*3/uL — ABNORMAL HIGH (ref 3.8–10.8)

## 2021-07-11 LAB — C-REACTIVE PROTEIN: CRP: 2 mg/L (ref <8.0)

## 2021-07-11 LAB — SEDIMENTATION RATE: Sed Rate: 9 mm/h (ref 0–20)

## 2021-07-11 LAB — TSH: TSH: 1.69 mIU/L

## 2021-07-11 LAB — URIC ACID: Uric Acid, Serum: 5 mg/dL (ref 2.5–7.0)

## 2021-07-11 LAB — VITAMIN B12: Vitamin B-12: 517 pg/mL (ref 200–1100)

## 2021-07-13 ENCOUNTER — Encounter: Payer: Self-pay | Admitting: Family Medicine

## 2021-07-14 NOTE — Telephone Encounter (Signed)
Is the chantix needing a PA?  I think this would be more effective than bupropion if she can get this.

## 2021-07-16 ENCOUNTER — Telehealth: Payer: Self-pay

## 2021-07-16 NOTE — Telephone Encounter (Addendum)
Initiated Prior authorization WUJ:[id number] Tartrate 0.5 MG X 11 &1 MG X 42 tablets ?Via: Covermymeds ?Case/Key:[id number] ?Status: denied as of 07/16/21 ?Reason:The request for coverage for VARENICLINE TAB 0.5& 1MG , use as directed (53 per month), is denied. ?This decision is based on health plan criteria for VARENICLINE TAB 0.5& 1MG . This medicine is ?covered only if: ?All of the following: ?(1) You have failed or cannot use one of the following: ?[id number] ?All [employer]? trademarks and logos are owned by [employer]. All other brand or product names are trademarks or registered marks of their respective ?owners. ?? 2022 [employer] All rights reserved. [phone] Page 2 of 8 ?  ?(A) Nicotine replacement patches over-the-counter (for example, Nicoderm CQ over-the-counter). ?(B) Nicotine gum over-the-counter (for example, Nicorette gum over-the-counter). ?(C) Nicotine lozenge or mini-lozenge over-the-counter (for example, Nicorette lozenge over-thecounter). ?(2) You have failed or cannot use bupropion (generic Zyban). ?The information provided does not show that you meet the criteria listed above. ?Notified Pt via: Mychart  ?

## 2021-07-24 MED ORDER — BUPROPION HCL ER (SR) 150 MG PO TB12: ORAL_TABLET | ORAL | 2 refills | Status: DC

## 2021-07-24 NOTE — Addendum Note (Signed)
Addended by: Mammie Lorenzo on: 07/24/2021 01:03 PM ? ? Modules accepted: Orders ? ?

## 2021-07-24 NOTE — Telephone Encounter (Signed)
Please inform patient that bupropion has been sent in.   ? ?Thanks! ?CM

## 2021-11-01 ENCOUNTER — Other Ambulatory Visit: Payer: Self-pay | Admitting: Family Medicine

## 2021-11-11 ENCOUNTER — Telehealth: Payer: Self-pay

## 2021-11-11 NOTE — Telephone Encounter (Signed)
Pt called and LVM requesting return call to discuss a rectal issue that she is having.  Returned call and LVM requesting that she call us back.  Tiajuana Amass, CMA

## 2021-11-11 NOTE — Telephone Encounter (Signed)
Spoke with pt who states that she is having a lot of rectal swelling internally and externally but denies bleeding, fever, chills, or abdominal pain.  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   Pt scheduled appt for 11/13/21 but is requesting a note to be allowed to go home for the remainder of today.  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  Per Dr. Molli Hazard, pt provided with work note.  She also states that she had a polyp removed in the end of July in this same area.  I advised the pt to call GI to see if they could possibly see her sooner than 11/13/21.  She expressed understanding and is agreeable.  Tiajuana Amass, CMA

## 2021-11-13 ENCOUNTER — Ambulatory Visit (INDEPENDENT_AMBULATORY_CARE_PROVIDER_SITE_OTHER): Payer: 59 | Admitting: Family Medicine

## 2021-11-13 VITALS — BP 126/74 | HR 65 | Ht 65.0 in | Wt 184.0 lb

## 2021-11-13 DIAGNOSIS — M79644 Pain in right finger(s): Secondary | ICD-10-CM | POA: Diagnosis not present

## 2021-11-13 DIAGNOSIS — K649 Unspecified hemorrhoids: Secondary | ICD-10-CM

## 2021-11-13 DIAGNOSIS — M79645 Pain in left finger(s): Secondary | ICD-10-CM | POA: Diagnosis not present

## 2021-11-13 MED ORDER — HYDROCORTISONE 2.5 % EX CREA: TOPICAL_CREAM | Freq: Two times a day (BID) | CUTANEOUS | 0 refills | Status: DC | PRN

## 2021-11-13 NOTE — Patient Instructions (Signed)
Also pick up some lidocaine gel

## 2021-11-13 NOTE — Progress Notes (Signed)
   Acute Office Visit  Subjective:     Patient ID: Michele Herrera, female    DOB: 1976/01/18, 46 y.o.   MRN: [id number]  Chief Complaint  Patient presents with   Rectal Pain    Swelling, since Monday    HPI Patient is in today for rectal swelling started on Monday. Water helps.  Getting worse.  No change in BMS.  She has had rectal fissures before but never had hemorrhoids.  She did have a colonoscopy about 4 weeks ago.  Also complains of bilateral thumb pain mostly at the base of the thumbs.  She does a lot of gripping and heavy lifting during the day.  ROS      Objective:    BP 126/74   Pulse 65   Ht 5\' 5"  (1.651 m)   Wt 184 lb (83.5 kg)   SpO2 99%   BMI 30.62 kg/m    Physical Exam Vitals reviewed.  Constitutional:      Appearance: She is well-developed.  HENT:     Head: Normocephalic and atraumatic.  Eyes:     Conjunctiva/sclera: Conjunctivae normal.  Cardiovascular:     Rate and Rhythm: Normal rate.  Pulmonary:     Effort: Pulmonary effort is normal.  Genitourinary:    Comments: She has an approximately 1 cm raised pink very tender hemorrhoid at the 6 o'clock position and a smaller one at the 12 o'clock position.  No drainage or tears or sign of thrombosis. Musculoskeletal:     Comments: He has squaring off of the thumb joints at the bases bilaterally.  Normal range of motion.  Discrete swelling or synovitis on exam.  Skin:    General: Skin is dry.     Coloration: Skin is not pale.  Neurological:     Mental Status: She is alert and oriented to person, place, and time.  Psychiatric:        Behavior: Behavior normal.     No results found for any visits on 11/13/21.      Assessment & Plan:   Problem List Items Addressed This Visit   None Visit Diagnoses     Hemorrhoids, unspecified hemorrhoid type    -  Primary   Relevant Medications   hydrocortisone 2.5 % cream   Bilateral thumb pain          Right-discussed treatment with sitz baths,  topical steroid cream, lidocaine gel and softening bowel movements with stool softener such as Colace.  Call if not improving over the next couple of weeks.  Bilateral thumb pain over the [facility] joints-she definitely has some arthritis based on exam today.  There may even be a little bit of strain of the tendons and she does a lot of heavy lifting at work.  We discussed maybe changing how she is lifting and pulling things and using her thumbs to see if this helps if not we can always get her in with our sports med doc for further evaluation and recommendations.   Meds ordered this encounter  Medications   hydrocortisone 2.5 % cream    Sig: Apply topically 2 (two) times daily as needed. To rectal area    Dispense:  30 g    Refill:  0    No follow-ups on file.  [facility], MD

## 2021-11-24 ENCOUNTER — Encounter: Payer: Self-pay | Admitting: Family Medicine

## 2021-11-24 DIAGNOSIS — K649 Unspecified hemorrhoids: Secondary | ICD-10-CM

## 2021-11-24 NOTE — Telephone Encounter (Signed)
OK To place referral to general surgery for more urgent consult for hemorrhoids.  Went to see if she has a preference for provider or location

## 2021-11-28 NOTE — Telephone Encounter (Signed)
Michele Herrera can you call [facility] general surgery and see if they can get her in sooner if they can we can always place a new referral.  It is for hemorrhoids.

## 2022-01-13 LAB — HM MAMMOGRAPHY

## 2022-01-19 ENCOUNTER — Telehealth: Payer: 59 | Admitting: Family Medicine

## 2022-02-17 ENCOUNTER — Encounter: Payer: Self-pay | Admitting: Family Medicine

## 2022-04-07 ENCOUNTER — Emergency Department (HOSPITAL_COMMUNITY)
Admission: EM | Admit: 2022-04-07 | Discharge: 2022-04-08 | Discharge status: Against Medical Advice | Payer: 59 | Attending: Emergency Medicine | Admitting: Emergency Medicine

## 2022-04-07 ENCOUNTER — Encounter (HOSPITAL_COMMUNITY): Payer: Self-pay

## 2022-04-07 ENCOUNTER — Other Ambulatory Visit: Payer: Self-pay

## 2022-04-07 ENCOUNTER — Emergency Department (HOSPITAL_COMMUNITY): Payer: 59

## 2022-04-07 DIAGNOSIS — R079 Chest pain, unspecified: Secondary | ICD-10-CM | POA: Diagnosis not present

## 2022-04-07 DIAGNOSIS — Z5321 Procedure and treatment not carried out due to patient leaving prior to being seen by health care provider: Secondary | ICD-10-CM | POA: Insufficient documentation

## 2022-04-07 LAB — CBC
HCT: 36.5 % (ref 36.0–46.0)
Hemoglobin: 12.8 g/dL (ref 12.0–15.0)
MCH: 34 pg (ref 26.0–34.0)
MCHC: 35.1 g/dL (ref 30.0–36.0)
MCV: 96.8 fL (ref 80.0–100.0)
Platelets: 287 10*3/uL (ref 150–400)
RBC: 3.77 MIL/uL — ABNORMAL LOW (ref 3.87–5.11)
RDW: 11.5 % (ref 11.5–15.5)
WBC: 9.9 10*3/uL (ref 4.0–10.5)
nRBC: 0 % (ref 0.0–0.2)

## 2022-04-07 LAB — TROPONIN I (HIGH SENSITIVITY): Troponin I (High Sensitivity): <2 ng/L (ref <18)

## 2022-04-07 LAB — BASIC METABOLIC PANEL
Anion gap: 10 (ref 5–15)
BUN: 16 mg/dL (ref 6–20)
CO2: 25 mmol/L (ref 22–32)
Calcium: 9.5 mg/dL (ref 8.9–10.3)
Chloride: 104 mmol/L (ref 98–111)
Creatinine, Ser: 0.92 mg/dL (ref 0.44–1.00)
GFR, Estimated: >60 mL/min (ref >60)
Glucose, Bld: 105 mg/dL — ABNORMAL HIGH (ref 70–99)
Potassium: 3.8 mmol/L (ref 3.5–5.1)
Sodium: 139 mmol/L (ref 135–145)

## 2022-04-07 NOTE — ED Provider Triage Note (Signed)
Emergency Medicine Provider Triage Evaluation Note  Michele Herrera , a 47 y.o. female  was evaluated in triage.  Pt complains of 6/10 chest pain radiating to right jaw onset PTA.  Her chest pain lasted for approximately 5 minutes.  She notes that her chest pain has decreased at this time.  She was walking to the desk when her chest pain began. Denies nausea, vomiting, shortness of breath. Denies PMHx of MI, DM, HTN, family history of MI in someone younger than age 32, stents.   Review of Systems  Positive:  Negative:   Physical Exam  BP 120/78   Pulse 61   Temp 98.1 F (36.7 C) (Oral)   Resp 12   Ht 5\' 5"  (1.651 m)   Wt 83.9 kg   SpO2 100%   BMI 30.79 kg/m  Gen:   Awake, no distress   Resp:  Normal effort  MSK:   Moves extremities without difficulty  Other:  No chest wall tenderness to palpation  Medical Decision Making  Medically screening exam initiated at 7:29 PM.  Appropriate orders placed.  Michele Herrera was informed that the remainder of the evaluation will be completed by another provider, this initial triage assessment does not replace that evaluation, and the importance of remaining in the ED until their evaluation is complete.  Workup initiated   Kevion Fatheree A, PA-C 04/07/22 1934

## 2022-04-07 NOTE — ED Triage Notes (Signed)
Pt BIB EMS with c/o CP that started today. Pt denies SOB or n/v. Per pt, aspirin makes her "feel weird." Per EMS, pain went away before transport.  CBG 76 HR 70 114/67

## 2022-04-08 ENCOUNTER — Encounter: Payer: Self-pay | Admitting: Family Medicine

## 2022-04-08 ENCOUNTER — Ambulatory Visit: Payer: 59 | Admitting: Family Medicine

## 2022-04-08 VITALS — BP 139/80 | HR 70 | Temp 98.8°F | Ht 65.0 in | Wt 181.8 lb

## 2022-04-08 DIAGNOSIS — R079 Chest pain, unspecified: Secondary | ICD-10-CM | POA: Diagnosis not present

## 2022-04-08 LAB — TROPONIN I (HIGH SENSITIVITY): Troponin I (High Sensitivity): <2 ng/L (ref <18)

## 2022-04-08 NOTE — ED Notes (Signed)
Pt states they are leaving, encouraged to stay. Pt refusing to stay, IV removed.

## 2022-04-08 NOTE — Progress Notes (Signed)
Acute Office Visit  Subjective:     Patient ID: Michele Herrera, female    DOB: September 27, 1975, 47 y.o.   MRN: [id number]  Chief Complaint  Patient presents with   Chest Pain    Pt states that while at work she had an episode where she describes overwhelming chest pressure. She says that she has an EMT on site that came to her aide and her BP was elevated. She denies dizziness, headache, or leg swelling. But does states that she has soreness where she had pressure.     HPI Patient is in today for chest pressure in chest radiating up to right side of neck minimal exertion at work yesterday, endorses feeling short of breath at that time.  EMS called:  BP 160's/80's. Heart rate in 40's at that time. Blood sugar 71 after eating 5 hours before. Denies nausea, vomiting, diaphoresis, endorses feeling lightheaded while this was occurring.  Felt same pressure this morning with shortness of breath when taking a shower which resolved in less than one minute.  Feels chest wall soreness in area of pressure.  Uses vape. Does not use elicit drugs, or alcohol.    Chart review:  ED visit 04/07/22 for chest pain. EMS took her to ED for evaluation. Left without being seen.  Troponin T negative x 2. EKG: NSR. CXR: negative for acute findings.  PHQ-9: 8, denies suicidal thoughts.  GAD-7: 8 Has had panic attack in past, different from yesterday.   Review of Systems  Constitutional:  Negative for chills and fever.  HENT:  Negative for sore throat.   Respiratory:  Positive for shortness of breath.   Cardiovascular:  Positive for chest pain and palpitations (not new).  Gastrointestinal:  Negative for heartburn, nausea and vomiting.  Neurological:  Negative for weakness.       Felt lightheaded yesterday and this morning in shower.   Psychiatric/Behavioral:  Negative for depression and suicidal ideas. The patient is nervous/anxious.         Objective:    BP 139/80   Pulse 70   Temp 98.8 F (37.1 C)  (Oral)   Ht 5\' 5"  (1.651 m)   Wt 181 lb 12.8 oz (82.5 kg)   SpO2 99%   BMI 30.25 kg/m    Physical Exam Vitals and nursing note reviewed.  Constitutional:      General: She is not in acute distress.    Appearance: Normal appearance.  Cardiovascular:     Rate and Rhythm: Normal rate and regular rhythm.     Heart sounds: Normal heart sounds.  Pulmonary:     Effort: Pulmonary effort is normal.     Breath sounds: Normal breath sounds.  Chest:     Chest wall: Tenderness present.     Comments: Chest wall tenderness with palpation.  Skin:    General: Skin is warm and dry.     Capillary Refill: Capillary refill takes less than 2 seconds.  Neurological:     General: No focal deficit present.     Mental Status: She is alert. Mental status is at baseline.  Psychiatric:        Mood and Affect: Mood normal.        Behavior: Behavior normal.        Thought Content: Thought content normal.        Judgment: Judgment normal.    Results for orders placed or performed during the hospital encounter of 62/69/48  Basic metabolic panel  Result Value  Ref Range   Sodium 139 135 - 145 mmol/L   Potassium 3.8 3.5 - 5.1 mmol/L   Chloride 104 98 - 111 mmol/L   CO2 25 22 - 32 mmol/L   Glucose, Bld 105 (H) 70 - 99 mg/dL   BUN 16 6 - 20 mg/dL   Creatinine, Ser 0.92 0.44 - 1.00 mg/dL   Calcium 9.5 8.9 - 10.3 mg/dL   GFR, Estimated >60 >60 mL/min   Anion gap 10 5 - 15  CBC  Result Value Ref Range   WBC 9.9 4.0 - 10.5 K/uL   RBC 3.77 (L) 3.87 - 5.11 MIL/uL   Hemoglobin 12.8 12.0 - 15.0 g/dL   HCT 36.5 36.0 - 46.0 %   MCV 96.8 80.0 - 100.0 fL   MCH 34.0 26.0 - 34.0 pg   MCHC 35.1 30.0 - 36.0 g/dL   RDW 11.5 11.5 - 15.5 %   Platelets 287 150 - 400 K/uL   nRBC 0.0 0.0 - 0.2 %  Troponin I (High Sensitivity)  Result Value Ref Range   Troponin I (High Sensitivity) <2 <18 ng/L  Troponin I (High Sensitivity)  Result Value Ref Range   Troponin I (High Sensitivity) <2 <18 ng/L         Assessment & Plan:   Problem List Items Addressed This Visit     Chest pain - Primary    Reports chest pressure with minimal exertion yesterday at work.  EMS was notified, took patient to the emergency department for further evaluation.  Patient left without being seen, however, lab work was done with an EKG.  Troponin T was negative x 2, EKG showed normal sinus rhythm, chest x-ray negative for acute findings.  Patient reports that she experienced the same pressure with shortness of breath briefly this morning while taking a shower.  Endorses history of anxiety, and has had panic attacks in the past, however, this is not the same.  Reports chest wall tenderness in the areas that she felt the pressure.  Denies nausea, vomiting, diaphoresis.  Endorses feeling lightheaded while the episode was occurring.  Denies illicit drug use and alcohol use.  She does vape.  Shared decision making, instructed patient if the symptoms occur again, she should go back to the emergency department for the full workup and be seen by a provider before leaving, otherwise, I placed a cardiology referral for further evaluation.  She agrees to return to the emergency department if symptoms return. Vital signs stable in office today. She is not in acute distress.       Relevant Orders   Ambulatory referral to Cardiology  Agrees with plan of care discussed.  Questions answered. See Dr. Zigmund Daniel as soon as possible for possible anxiety management.   No orders of the defined types were placed in this encounter.   Return for follow-up with Dr. Zigmund Daniel as soon as you can .  Michele Guest, FNP

## 2022-04-08 NOTE — Patient Instructions (Signed)
I placed a referral for a cardiologist today.  If you experience these symptoms again, please go to the ED immediately.

## 2022-04-08 NOTE — Assessment & Plan Note (Addendum)
Reports chest pressure with minimal exertion yesterday at work.  EMS was notified, took patient to the emergency department for further evaluation.  Patient left without being seen, however, lab work was done with an EKG.  Troponin T was negative x 2, EKG showed normal sinus rhythm, chest x-ray negative for acute findings.  Patient reports that she experienced the same pressure with shortness of breath briefly this morning while taking a shower.  Endorses history of anxiety, and has had panic attacks in the past, however, this is not the same.  Reports chest wall tenderness in the areas that she felt the pressure.  Denies nausea, vomiting, diaphoresis.  Endorses feeling lightheaded while the episode was occurring.  Denies illicit drug use and alcohol use.  She does vape.  Shared decision making, instructed patient if the symptoms occur again, she should go back to the emergency department for the full workup and be seen by a provider before leaving, otherwise, I placed a cardiology referral for further evaluation.  She agrees to return to the emergency department if symptoms return. Vital signs stable in office today. She is not in acute distress.

## 2022-04-10 ENCOUNTER — Telehealth: Payer: Self-pay | Admitting: General Practice

## 2022-04-10 NOTE — Telephone Encounter (Signed)
Transition Care Management Follow-up Telephone Call Date of discharge and from where: 04/08/22 from [facility] How have you been since you were released from the hospital? Patient had OV with Pecolia Ades NP on 04/08/22. Any questions or concerns? No

## 2022-04-13 ENCOUNTER — Encounter: Payer: Self-pay | Admitting: Cardiology

## 2022-04-13 ENCOUNTER — Ambulatory Visit: Payer: 59 | Admitting: Cardiology

## 2022-04-13 VITALS — BP 102/62 | HR 66 | Ht 65.0 in | Wt 183.0 lb

## 2022-04-13 DIAGNOSIS — R072 Precordial pain: Secondary | ICD-10-CM | POA: Diagnosis not present

## 2022-04-13 NOTE — Patient Instructions (Signed)
  Testing/Procedures:    Your cardiac CT will be scheduled at one of the below locations:   [facility] [address] [place], [place] [address] [phone]    If scheduled at [facility], please arrive at the [facility] and Children's Entrance (Entrance C2) of [facility] 30 minutes prior to test start time. You can use the FREE valet parking offered at entrance C (encouraged to control the heart rate for the test)  Proceed to the [facility] Radiology Department (first floor) to check-in and test prep.  All radiology patients and guests should use entrance C2 at [facility], accessed from [facility], even though the hospital's physical address listed is [address].    If scheduled at [facility] or [facility], please arrive 15 mins early for check-in and test prep.   Please follow these instructions carefully (unless otherwise directed):  On the Night Before the Test: Be sure to Drink plenty of water. Do not consume any caffeinated/decaffeinated beverages or chocolate 12 hours prior to your test. Do not take any antihistamines 12 hours prior to your test.  On the Day of the Test: Drink plenty of water until 1 hour prior to the test. Do not eat any food 1 hour prior to test. You may take your regular medications prior to the test.  FEMALES- please wear underwire-free bra if available, avoid dresses & tight clothing       After the Test: Drink plenty of water. After receiving IV contrast, you may experience a mild flushed feeling. This is normal. On occasion, you may experience a mild rash up to 24 hours after the test. This is not dangerous. If this occurs, you can take Benadryl 25 mg and increase your fluid intake. If you experience trouble breathing, this can be serious. If it is severe call 911 IMMEDIATELY. If it is mild, please call our office.  We will call to  schedule your test 2-4 weeks out understanding that some insurance companies will need an authorization prior to the service being performed.   For non-scheduling related questions, please contact the cardiac imaging nurse navigator should you have any questions/concerns: Marchia Bond, Cardiac Imaging Nurse Navigator Gordy Clement, Cardiac Imaging Nurse Navigator [facility] Direct Office Dial: [phone]   For scheduling needs, including cancellations and rescheduling, please call [place], [phone].    Follow-Up: At [facility], you and your health needs are our priority.  As part of our continuing mission to provide you with exceptional heart care, we have created designated Provider Care Teams.  These Care Teams include your primary Cardiologist (physician) and Advanced Practice Providers (APPs -  Physician Assistants and Nurse Practitioners) who all work together to provide you with the care you need, when you need it.  We recommend signing up for the patient portal called "MyChart".  Sign up information is provided on this After Visit Summary.  MyChart is used to connect with patients for Virtual Visits (Telemedicine).  Patients are able to view lab/test results, encounter notes, upcoming appointments, etc.  Non-urgent messages can be sent to your provider as well.   To learn more about what you can do with MyChart, go to [email or web address].    Your next appointment:    As needed

## 2022-04-13 NOTE — Progress Notes (Signed)
Referring-Cody Rodena Piety, DO Reason for referral-chest pain  HPI: 47 year old female for evaluation of chest pain at request of Weston Settle, DO.  Patient seen recently in the emergency room with chest pain.  Troponins were normal.  Electrocardiogram showed no ST changes.  Chest x-ray showed no active disease.  Hemoglobin 12.8.  Creatinine 0.92.  Patient left before evaluation complete.  Cardiology now asked to evaluate.  Approximately 1 week ago while she was at work patient developed chest pressure radiating to her shoulders and back.  Symptoms lasted approximately 5 minutes.  Not pleuritic or positional.  There was some dyspnea but no diaphoresis or nausea.  She has had some residual pain in her left neck area.  She otherwise denies dyspnea on exertion, orthopnea, PND, exertional chest pain or syncope.  Cardiology asked to evaluate.  No current outpatient medications on file.   No current facility-administered medications for this visit.    No Known Allergies   Past Medical History:  Diagnosis Date   Anxiety 2000   I do not know an exact date   Arthritis    Bronchitis    Colon cancer screening 07/10/2021   GERD (gastroesophageal reflux disease)    Pneumonia    Seasonal allergies     Past Surgical History:  Procedure Laterality Date   TEMPOROMANDIBULAR JOINT SURGERY     TMJ surgeries     TONSILLECTOMY     TUBAL LIGATION      Social History   Socioeconomic History   Marital status: Divorced    Spouse name: Not on file   Number of children: 2   Years of education: Not on file   Highest education level: Not on file  Occupational History   Not on file  Tobacco Use   Smoking status: Every Day    Types: E-cigarettes    Passive exposure: Never   Smokeless tobacco: Never  Vaping Use   Vaping Use: Never used  Substance and Sexual Activity   Alcohol use: Yes    Comment: ocassional   Drug use: No   Sexual activity: Yes    Birth control/protection: None  Other  Topics Concern   Not on file  Social History Narrative   Not on file   Social Determinants of Health   Financial Resource Strain: Not on file  Food Insecurity: Not on file  Transportation Needs: Not on file  Physical Activity: Not on file  Stress: Not on file  Social Connections: Not on file  Intimate Partner Violence: Not on file    Family History  Problem Relation Age of Onset   Cancer Mother    Hypertension Mother    Arthritis Mother    COPD Mother    Early death Mother    Hypertension Father    Cancer Maternal Grandmother    Cancer Maternal Grandfather    Hypertension Maternal Grandfather    Cancer Paternal Grandmother    Hypertension Paternal Grandmother    Heart disease Paternal Grandmother    Hearing loss Paternal Grandmother    Cancer Paternal Grandfather    Coronary artery disease Other    Cancer Other    Arthritis Other    ADD / ADHD Daughter     ROS: no fevers or chills, productive cough, hemoptysis, dysphasia, odynophagia, melena, hematochezia, dysuria, hematuria, rash, seizure activity, orthopnea, PND, pedal edema, claudication. Remaining systems are negative.  Physical Exam:   Blood pressure 102/62, pulse 66, height 5\' 5"  (1.651 m), weight 183 lb (83  kg), SpO2 99 %.  General:  Well developed/well nourished in NAD Skin warm/dry Patient not depressed No peripheral clubbing Back-normal HEENT-normal/normal eyelids Neck supple/normal carotid upstroke bilaterally; no bruits; no JVD; no thyromegaly chest - CTA/ normal expansion CV - RRR/normal S1 and S2; no murmurs, rubs or gallops;  PMI nondisplaced Abdomen -NT/ND, no HSM, no mass, + bowel sounds, no bruit 2+ femoral pulses, no bruits Ext-no edema, chords, 2+ DP Neuro-grossly nonfocal  ECG -April 07, 2022-sinus rhythm with no ST changes.  Personally reviewed Todays NSR, no ST changes; personally reviewed.  A/P  1 chest pain-symptoms are atypical.  Electrocardiogram shows no ST changes.   Previous troponins were normal.  I will arrange a cardiac CTA to rule out obstructive coronary disease.  2 history of tobacco abuse-patient educated on the importance of discontinuing.  She vapes now and I would like her to avoid this as well.  Kirk Ruths, MD

## 2022-04-16 ENCOUNTER — Telehealth: Payer: 59 | Admitting: Physician Assistant

## 2022-04-16 DIAGNOSIS — U071 COVID-19: Secondary | ICD-10-CM | POA: Diagnosis not present

## 2022-04-16 MED ORDER — NIRMATRELVIR/RITONAVIR (PAXLOVID)TABLET: 3.0000 | ORAL_TABLET | Freq: Two times a day (BID) | ORAL | 0 refills | Status: AC

## 2022-04-16 MED ORDER — FLUTICASONE PROPIONATE 50 MCG/ACT NA SUSP: 2.0000 | Freq: Every day | NASAL | 0 refills | Status: DC

## 2022-04-16 MED ORDER — BENZONATATE 100 MG PO CAPS: 100.0000 mg | ORAL_CAPSULE | Freq: Three times a day (TID) | ORAL | 0 refills | Status: DC | PRN

## 2022-04-16 NOTE — Progress Notes (Signed)
Virtual Visit Consent   Michele Herrera, you are scheduled for a virtual visit with a [facility] provider today. Just as with appointments in the office, your consent must be obtained to participate. Your consent will be active for this visit and any virtual visit you may have with one of our providers in the next 365 days. If you have a MyChart account, a copy of this consent can be sent to you electronically.  As this is a virtual visit, video technology does not allow for your provider to perform a traditional examination. This may limit your provider's ability to fully assess your condition. If your provider identifies any concerns that need to be evaluated in person or the need to arrange testing (such as labs, EKG, etc.), we will make arrangements to do so. Although advances in technology are sophisticated, we cannot ensure that it will always work on either your end or our end. If the connection with a video visit is poor, the visit may have to be switched to a telephone visit. With either a video or telephone visit, we are not always able to ensure that we have a secure connection.  By engaging in this virtual visit, you consent to the provision of healthcare and authorize for your insurance to be billed (if applicable) for the services provided during this visit. Depending on your insurance coverage, you may receive a charge related to this service.  I need to obtain your verbal consent now. Are you willing to proceed with your visit today? Michele Herrera has provided verbal consent on 04/16/2022 for a virtual visit (video or telephone). Leeanne Rio, [place]  Date: 04/16/2022 8:52 AM  Virtual Visit via Video Note   I, Leeanne Rio, connected with  Michele Herrera  ([id number], 10-24-1975) on 04/16/22 at  8:45 AM EST by a video-enabled telemedicine application and verified that I am speaking with the correct person using two identifiers.  Location: Patient: Virtual Visit  Location Patient: Home Provider: Virtual Visit Location Provider: Home Office   I discussed the limitations of evaluation and management by telemedicine and the availability of in person appointments. The patient expressed understanding and agreed to proceed.    History of Present Illness: Michele Herrera is a 47 y.o. who identifies as a female who was assigned female at birth, and is being seen today for COVID-19. Symptoms starting on waking Tuesday with scratchy throat. Did not think much about it. As day progressed noted malaise and fatigue. Started with URI symptoms. Known exposure through coworkers so tested at home and positive. Since then noting increased nasal and sinus congestion, cough and fatigue. Denies chest pain or SOB. Denies nausea or vomiting . Some loose stool. T currently at 100.3 with antipyretic.   OTC -- Dayquil/Nyquil  HPI: HPI  Problems:  Patient Active Problem List   Diagnosis Date Noted   Chest pain 04/08/2022   Chronic joint pain 07/10/2021   Other fatigue 07/10/2021   Colon cancer screening 07/10/2021   Nicotine dependence 07/10/2021   LUMBAR RADICULOPATHY 11/13/2008   BREAST MASS, LEFT 02/03/2008    Allergies: No Known Allergies Medications:  Current Outpatient Medications:    benzonatate (TESSALON) 100 MG capsule, Take 1 capsule (100 mg total) by mouth 3 (three) times daily as needed for cough., Disp: 30 capsule, Rfl: 0   fluticasone (FLONASE) 50 MCG/ACT nasal spray, Place 2 sprays into both nostrils daily., Disp: 16 g, Rfl: 0   nirmatrelvir/ritonavir (PAXLOVID) 20 x 150  MG & 10 x 100MG  TABS, Take 3 tablets by mouth 2 (two) times daily for 5 days. (Take nirmatrelvir 150 mg two tablets twice daily for 5 days and ritonavir 100 mg one tablet twice daily for 5 days) Patient GFR is > 60, Disp: 30 tablet, Rfl: 0  Observations/Objective: Patient is well-developed, well-nourished in no acute distress.  Resting comfortably at home.  Head is normocephalic,  atraumatic.  No labored breathing. Speech is clear and coherent with logical content.  Patient is alert and oriented at baseline.   Assessment and Plan: 1. COVID-19 - benzonatate (TESSALON) 100 MG capsule; Take 1 capsule (100 mg total) by mouth 3 (three) times daily as needed for cough.  Dispense: 30 capsule; Refill: 0 - fluticasone (FLONASE) 50 MCG/ACT nasal spray; Place 2 sprays into both nostrils daily.  Dispense: 16 g; Refill: 0 - nirmatrelvir/ritonavir (PAXLOVID) 20 x 150 MG & 10 x 100MG  TABS; Take 3 tablets by mouth 2 (two) times daily for 5 days. (Take nirmatrelvir 150 mg two tablets twice daily for 5 days and ritonavir 100 mg one tablet twice daily for 5 days) Patient GFR is > 60  Dispense: 30 tablet; Refill: 0  Patient with multiple risk factors for complicated course of illness. Discussed risks/benefits of antiviral medications including most common potential ADRs. Patient voiced understanding and would like to proceed with antiviral medication. They are candidate for Paxlovid giving normal recent renal function. Rx sent to pharmacy. Supportive measures, OTC medications and vitamin regimen reviewed. Tessalon and Flonase per orders.  Quarantine reviewed in detail. Strict ER precautions discussed with patient.    Follow Up Instructions: I discussed the assessment and treatment plan with the patient. The patient was provided an opportunity to ask questions and all were answered. The patient agreed with the plan and demonstrated an understanding of the instructions.  A copy of instructions were sent to the patient via MyChart unless otherwise noted below.   The patient was advised to call back or seek an in-person evaluation if the symptoms worsen or if the condition fails to improve as anticipated.  Time:  I spent 10 minutes with the patient via telehealth technology discussing the above problems/concerns.    Leeanne Rio, PA-C

## 2022-04-16 NOTE — Patient Instructions (Signed)
Silvano Bilis, thank you for joining Piedad Climes, PA-C for today's virtual visit.  While this provider is not your primary care provider (PCP), if your PCP is located in our provider database this encounter information will be shared with them immediately following your visit.   A [facility] MyChart account gives you access to today's visit and all your visits, tests, and labs performed at [facility] " click here if you don't have a [facility] MyChart account or go to [email or web address]  Consent: (Patient) Michele Herrera provided verbal consent for this virtual visit at the beginning of the encounter.  Current Medications: No current outpatient medications on file.   Medications ordered in this encounter:  No orders of the defined types were placed in this encounter.    *If you need refills on other medications prior to your next appointment, please contact your pharmacy*  Follow-Up: Call back or seek an in-person evaluation if the symptoms worsen or if the condition fails to improve as anticipated.   Virtual Care [phone]  Other Instructions Please keep well-hydrated and get plenty of rest. Start a saline nasal rinse to flush out your nasal passages. You can use plain Mucinex to help thin congestion. Use the Tessalon and Flonase as directed.  If you have a humidifier, running in the bedroom at night. I want you to start OTC vitamin D3 1000 units daily, vitamin C 1000 mg daily, and a zinc supplement. Please take prescribed medications as directed.  You have been enrolled in a MyChart symptom monitoring program. Please answer these questions daily so we can keep track of how you are doing.  You were to quarantine for 5 days from onset of your symptoms.  After day 5, if you have had no fever and you are feeling better, you can end quarantine but need to mask for an additional 5 days. After day 5 if you have a fever or are having  significant symptoms, please quarantine for full 10 days.  If you note any worsening of symptoms, any significant shortness of breath or any chest pain, please seek ER evaluation ASAP.  Please do not delay care!  COVID-19: What to Do if You Are Sick If you test positive and are an older adult or someone who is at high risk of getting very sick from COVID-19, treatment may be available. Contact a healthcare provider right away after a positive test to determine if you are eligible, even if your symptoms are mild right now. You can also visit a Test to Treat location and, if eligible, receive a prescription from a provider. Don't delay: Treatment must be started within the first few days to be effective. If you have a fever, cough, or other symptoms, you might have COVID-19. Most people have mild illness and are able to recover at home. If you are sick: Keep track of your symptoms. If you have an emergency warning sign (including trouble breathing), call 911. Steps to help prevent the spread of COVID-19 if you are sick If you are sick with COVID-19 or think you might have COVID-19, follow the steps below to care for yourself and to help protect other people in your home and community. Stay home except to get medical care Stay home. Most people with COVID-19 have mild illness and can recover at home without medical care. Do not leave your home, except to get medical care. Do not visit public areas and do not go to places where you  are unable to wear a mask. Take care of yourself. Get rest and stay hydrated. Take over-the-counter medicines, such as acetaminophen, to help you feel better. Stay in touch with your doctor. Call before you get medical care. Be sure to get care if you have trouble breathing, or have any other emergency warning signs, or if you think it is an emergency. Avoid public transportation, ride-sharing, or taxis if possible. Get tested If you have symptoms of COVID-19, get tested.  While waiting for test results, stay away from others, including staying apart from those living in your household. Get tested as soon as possible after your symptoms start. Treatments may be available for people with COVID-19 who are at risk for becoming very sick. Don't delay: Treatment must be started early to be effective--some treatments must begin within 5 days of your first symptoms. Contact your healthcare provider right away if your test result is positive to determine if you are eligible. Self-tests are one of several options for testing for the virus that causes COVID-19 and may be more convenient than laboratory-based tests and point-of-care tests. Ask your healthcare provider or your local health department if you need help interpreting your test results. You can visit your state, tribal, local, and territorial health department's website to look for the latest local information on testing sites. Separate yourself from other people As much as possible, stay in a specific room and away from other people and pets in your home. If possible, you should use a separate bathroom. If you need to be around other people or animals in or outside of the home, wear a well-fitting mask. Tell your close contacts that they may have been exposed to COVID-19. An infected person can spread COVID-19 starting 48 hours (or 2 days) before the person has any symptoms or tests positive. By letting your close contacts know they may have been exposed to COVID-19, you are helping to protect everyone. See COVID-19 and Animals if you have questions about pets. If you are diagnosed with COVID-19, someone from the health department may call you. Answer the call to slow the spread. Monitor your symptoms Symptoms of COVID-19 include fever, cough, or other symptoms. Follow care instructions from your healthcare provider and local health department. Your local health authorities may give instructions on checking your symptoms  and reporting information. When to seek emergency medical attention Look for emergency warning signs* for COVID-19. If someone is showing any of these signs, seek emergency medical care immediately: Trouble breathing Persistent pain or pressure in the chest New confusion Inability to wake or stay awake Pale, gray, or blue-colored skin, lips, or nail beds, depending on skin tone *This list is not all possible symptoms. Please call your medical provider for any other symptoms that are severe or concerning to you. Call 911 or call ahead to your local emergency facility: Notify the operator that you are seeking care for someone who has or may have COVID-19. Call ahead before visiting your doctor Call ahead. Many medical visits for routine care are being postponed or done by phone or telemedicine. If you have a medical appointment that cannot be postponed, call your doctor's office, and tell them you have or may have COVID-19. This will help the office protect themselves and other patients. If you are sick, wear a well-fitting mask You should wear a mask if you must be around other people or animals, including pets (even at home). Wear a mask with the best fit, protection, and comfort for  you. You don't need to wear the mask if you are alone. If you can't put on a mask (because of trouble breathing, for example), cover your coughs and sneezes in some other way. Try to stay at least 6 feet away from other people. This will help protect the people around you. Masks should not be placed on young children under age 38 years, anyone who has trouble breathing, or anyone who is not able to remove the mask without help. Cover your coughs and sneezes Cover your mouth and nose with a tissue when you cough or sneeze. Throw away used tissues in a lined trash can. Immediately wash your hands with soap and water for at least 20 seconds. If soap and water are not available, clean your hands with an alcohol-based  hand sanitizer that contains at least 60% alcohol. Clean your hands often Wash your hands often with soap and water for at least 20 seconds. This is especially important after blowing your nose, coughing, or sneezing; going to the bathroom; and before eating or preparing food. Use hand sanitizer if soap and water are not available. Use an alcohol-based hand sanitizer with at least 60% alcohol, covering all surfaces of your hands and rubbing them together until they feel dry. Soap and water are the best option, especially if hands are visibly dirty. Avoid touching your eyes, nose, and mouth with unwashed hands. Handwashing Tips Avoid sharing personal household items Do not share dishes, drinking glasses, cups, eating utensils, towels, or bedding with other people in your home. Wash these items thoroughly after using them with soap and water or put in the dishwasher. Clean surfaces in your home regularly Clean and disinfect high-touch surfaces (for example, doorknobs, tables, handles, light switches, and countertops) in your "sick room" and bathroom. In shared spaces, you should clean and disinfect surfaces and items after each use by the person who is ill. If you are sick and cannot clean, a caregiver or other person should only clean and disinfect the area around you (such as your bedroom and bathroom) on an as needed basis. Your caregiver/other person should wait as long as possible (at least several hours) and wear a mask before entering, cleaning, and disinfecting shared spaces that you use. Clean and disinfect areas that may have blood, stool, or body fluids on them. Use household cleaners and disinfectants. Clean visible dirty surfaces with household cleaners containing soap or detergent. Then, use a household disinfectant. Use a product from [employer] List N: Disinfectants for Coronavirus (WUJWJ-19). Be sure to follow the instructions on the label to ensure safe and effective use of the product.  Many products recommend keeping the surface wet with a disinfectant for a certain period of time (look at "contact time" on the product label). You may also need to wear personal protective equipment, such as gloves, depending on the directions on the product label. Immediately after disinfecting, wash your hands with soap and water for 20 seconds. For completed guidance on cleaning and disinfecting your home, visit Complete Disinfection Guidance. Take steps to improve ventilation at home Improve ventilation (air flow) at home to help prevent from spreading COVID-19 to other people in your household. Clear out COVID-19 virus particles in the air by opening windows, using air filters, and turning on fans in your home. Use this interactive tool to learn how to improve air flow in your home. When you can be around others after being sick with COVID-19 Deciding when you can be around others is different  for different situations. Find out when you can safely end home isolation. For any additional questions about your care, contact your healthcare provider or state or local health department. 06/04/2020 Content source: [facility] ([facility]), Division of Viral Diseases This information is not intended to replace advice given to you by your health care provider. Make sure you discuss any questions you have with your health care provider. Document Revised: 07/18/2020 Document Reviewed: 07/18/2020 Elsevier Patient Education  2022 [employer].  If you have been instructed to have an in-person evaluation today at a local Urgent Care facility, please use the link below. It will take you to a list of all of our available [facility], including address, phone number and hours of operation. Please do not delay care.  [facility]  If you or a family member do not have a primary care provider, use the link below to schedule a visit and  establish care. When you choose a [facility] primary care physician or advanced practice provider, you gain a long-term partner in health. Find a Primary Care Provider  Learn more about [facility]'s in-office and virtual care options: [facility]

## 2022-04-17 ENCOUNTER — Telehealth (HOSPITAL_COMMUNITY): Payer: Self-pay | Admitting: *Deleted

## 2022-04-17 NOTE — Telephone Encounter (Signed)
Reaching out to patient to offer assistance regarding upcoming cardiac imaging study; pt verbalizes understanding of appt date/time, parking situation and where to check in, pre-test NPO status, and verified current allergies; name and call back number provided for further questions should they arise  Michele Clement RN Navigator Cardiac [facility] [phone] office [phone] cell  Patient aware to arrive at 12:30 PM.

## 2022-04-20 ENCOUNTER — Ambulatory Visit (HOSPITAL_COMMUNITY): Admission: RE | Admit: 2022-04-20 | Payer: 59 | Source: Ambulatory Visit

## 2022-04-29 ENCOUNTER — Encounter: Payer: Self-pay | Admitting: Family Medicine

## 2022-04-29 ENCOUNTER — Ambulatory Visit: Payer: 59 | Admitting: Family Medicine

## 2022-04-29 VITALS — BP 108/71 | HR 50 | Ht 60.0 in | Wt 179.8 lb

## 2022-04-29 DIAGNOSIS — R002 Palpitations: Secondary | ICD-10-CM

## 2022-04-29 DIAGNOSIS — R232 Flushing: Secondary | ICD-10-CM

## 2022-04-29 DIAGNOSIS — R079 Chest pain, unspecified: Secondary | ICD-10-CM

## 2022-04-29 NOTE — Progress Notes (Signed)
Michele Herrera - 47 y.o. female MRN [id number]  Date of birth: 1975/05/22  Subjective Chief Complaint  Patient presents with   Chest Pain    HPI Michele Herrera is 47 y.o. female here today for follow-up visit.  Seen a few weeks ago with complaint of chest pain.  Referred to cardiology and did have recent visit.  No significant abnormalities noted but does have upcoming CT coronary morphology with CTA scoring.   She has not had any further chest pain since her visit on 1/24.  She does admit to having some increased stress related to work.  Additionally, think she may be going through menopause and hormonal changes may be contributing to this.  She has had some increased hot flashes.  Denies night sweats.  She has noted some occasional palpitations.  ROS:  A comprehensive ROS was completed and negative except as noted per HPI  No Known Allergies  Past Medical History:  Diagnosis Date   Anxiety 2000   I do not know an exact date   Arthritis    Bronchitis    Colon cancer screening 07/10/2021   GERD (gastroesophageal reflux disease)    Pneumonia    Seasonal allergies     Past Surgical History:  Procedure Laterality Date   TEMPOROMANDIBULAR JOINT SURGERY     TMJ surgeries     TONSILLECTOMY     TUBAL LIGATION      Social History   Socioeconomic History   Marital status: Divorced    Spouse name: Not on file   Number of children: 2   Years of education: Not on file   Highest education level: Not on file  Occupational History   Not on file  Tobacco Use   Smoking status: Every Day    Types: E-cigarettes    Passive exposure: Never   Smokeless tobacco: Never  Vaping Use   Vaping Use: Never used  Substance and Sexual Activity   Alcohol use: Yes    Comment: ocassional   Drug use: No   Sexual activity: Yes    Birth control/protection: None  Other Topics Concern   Not on file  Social History Narrative   Not on file   Social Determinants of Health   Financial  Resource Strain: Not on file  Food Insecurity: Not on file  Transportation Needs: Not on file  Physical Activity: Not on file  Stress: Not on file  Social Connections: Not on file    Family History  Problem Relation Age of Onset   Cancer Mother    Hypertension Mother    Arthritis Mother    COPD Mother    Early death Mother    Hypertension Father    Cancer Maternal Grandmother    Cancer Maternal Grandfather    Hypertension Maternal Grandfather    Cancer Paternal Grandmother    Hypertension Paternal Grandmother    Heart disease Paternal Grandmother    Hearing loss Paternal Grandmother    Cancer Paternal Grandfather    Coronary artery disease Other    Cancer Other    Arthritis Other    ADD / ADHD Daughter     Health Maintenance  Topic Date Due   PAP SMEAR-Modifier  05/11/2022 (Originally 10/16/2021)   COVID-19 Vaccine (1) 05/15/2022 (Originally 11/20/1975)   Hepatitis C Screening  06/08/2022 (Originally 05/19/1993)   INFLUENZA VACCINE  06/14/2022 (Originally 10/14/2021)   COLONOSCOPY (Pts 45-101yr Insurance coverage will need to be confirmed)  05/11/2023 (Originally 05/19/2020)   HIV Screening  09/06/2023 (Originally 05/20/1990)   MAMMOGRAM  01/14/2023   HPV VACCINES  Aged Out   DTaP/Tdap/Td  Discontinued     ----------------------------------------------------------------------------------------------------------------------------------------------------------------------------------------------------------------- Physical Exam BP 108/71 (BP Location: Left Arm, Patient Position: Sitting, Cuff Size: Normal)   Pulse (!) 50   Ht 5' (1.524 m)   Wt 179 lb 13 oz (81.6 kg)   SpO2 100%   BMI 35.12 kg/m   Physical Exam Constitutional:      Appearance: Normal appearance.  HENT:     Head: Normocephalic and atraumatic.  Eyes:     General: No scleral icterus. Cardiovascular:     Rate and Rhythm: Normal rate and regular rhythm.  Pulmonary:     Effort: Pulmonary effort is normal.      Breath sounds: Normal breath sounds.  Musculoskeletal:     Cervical back: Neck supple.  Neurological:     Mental Status: She is alert.  Psychiatric:        Mood and Affect: Mood normal.        Behavior: Behavior normal.     ------------------------------------------------------------------------------------------------------------------------------------------------------------------------------------------------------------------- Assessment and Plan  Chest pain She does have upcoming coronary CT.  Encourage smoking cessation to reduce cardiac risk.  We discussed how stress can contribute to the chest pain as well.  Has had some occasional palpitations.  Checking labs including TSH, CBC, CMP and iron levels.   No orders of the defined types were placed in this encounter.   Return in about 4 weeks (around 05/27/2022) for F/u Labs.    This visit occurred during the SARS-CoV-2 public health emergency.  Safety protocols were in place, including screening questions prior to the visit, additional usage of staff PPE, and extensive cleaning of exam room while observing appropriate contact time as indicated for disinfecting solutions.

## 2022-04-29 NOTE — Assessment & Plan Note (Signed)
She does have upcoming coronary CT.  Encourage smoking cessation to reduce cardiac risk.  We discussed how stress can contribute to the chest pain as well.  Has had some occasional palpitations.  Checking labs including TSH, CBC, CMP and iron levels.

## 2022-04-30 LAB — CBC WITH DIFFERENTIAL/PLATELET
Absolute Monocytes: 398 cells/uL (ref 200–950)
Basophils Absolute: 38 cells/uL (ref 0–200)
Basophils Relative: 0.5 %
Eosinophils Absolute: 188 cells/uL (ref 15–500)
Eosinophils Relative: 2.5 %
HCT: 36.4 % (ref 35.0–45.0)
Hemoglobin: 12.4 g/dL (ref 11.7–15.5)
Lymphs Abs: 3345 cells/uL (ref 850–3900)
MCH: 32.8 pg (ref 27.0–33.0)
MCHC: 34.1 g/dL (ref 32.0–36.0)
MCV: 96.3 fL (ref 80.0–100.0)
MPV: 9.1 fL (ref 7.5–12.5)
Monocytes Relative: 5.3 %
Neutro Abs: 3533 cells/uL (ref 1500–7800)
Neutrophils Relative %: 47.1 %
Platelets: 355 10*3/uL (ref 140–400)
RBC: 3.78 10*6/uL — ABNORMAL LOW (ref 3.80–5.10)
RDW: 11.2 % (ref 11.0–15.0)
Total Lymphocyte: 44.6 %
WBC: 7.5 10*3/uL (ref 3.8–10.8)

## 2022-04-30 LAB — IRON,TIBC AND FERRITIN PANEL
%SAT: 31 % (calc) (ref 16–45)
Ferritin: 58 ng/mL (ref 16–232)
Iron: 93 ug/dL (ref 40–190)
TIBC: 297 mcg/dL (calc) (ref 250–450)

## 2022-04-30 LAB — COMPLETE METABOLIC PANEL WITH GFR
AG Ratio: 1.5 (calc) (ref 1.0–2.5)
ALT: 19 U/L (ref 6–29)
AST: 15 U/L (ref 10–35)
Albumin: 4.4 g/dL (ref 3.6–5.1)
Alkaline phosphatase (APISO): 35 U/L (ref 31–125)
BUN: 17 mg/dL (ref 7–25)
CO2: 26 mmol/L (ref 20–32)
Calcium: 10 mg/dL (ref 8.6–10.2)
Chloride: 105 mmol/L (ref 98–110)
Creat: 0.88 mg/dL (ref 0.50–0.99)
Globulin: 3 g/dL (calc) (ref 1.9–3.7)
Glucose, Bld: 93 mg/dL (ref 65–99)
Potassium: 4.8 mmol/L (ref 3.5–5.3)
Sodium: 141 mmol/L (ref 135–146)
Total Bilirubin: 0.7 mg/dL (ref 0.2–1.2)
Total Protein: 7.4 g/dL (ref 6.1–8.1)
eGFR: 82 mL/min/{1.73_m2} (ref >=60)

## 2022-04-30 LAB — TSH: TSH: 2.18 mIU/L

## 2022-05-27 ENCOUNTER — Ambulatory Visit: Payer: 59 | Admitting: Family Medicine

## 2022-06-24 ENCOUNTER — Ambulatory Visit (INDEPENDENT_AMBULATORY_CARE_PROVIDER_SITE_OTHER): Payer: 59

## 2022-06-24 ENCOUNTER — Encounter: Payer: Self-pay | Admitting: Emergency Medicine

## 2022-06-24 ENCOUNTER — Ambulatory Visit
Admission: EM | Admit: 2022-06-24 | Discharge: 2022-06-24 | Disposition: A | Discharge status: Home / Self Care | Payer: 59 | Attending: Family Medicine | Admitting: Family Medicine

## 2022-06-24 DIAGNOSIS — M542 Cervicalgia: Secondary | ICD-10-CM

## 2022-06-24 DIAGNOSIS — M25511 Pain in right shoulder: Secondary | ICD-10-CM | POA: Diagnosis not present

## 2022-06-24 MED ORDER — CELECOXIB 100 MG PO CAPS: 100.0000 mg | ORAL_CAPSULE | Freq: Two times a day (BID) | ORAL | 0 refills | Status: AC

## 2022-06-24 MED ORDER — PREDNISONE 10 MG (21) PO TBPK: ORAL_TABLET | Freq: Every day | ORAL | 0 refills | Status: DC

## 2022-06-24 NOTE — ED Provider Notes (Signed)
[facility]    CSN: [id number] Arrival date & time: 06/24/22  1344      History   Chief Complaint Chief Complaint  Patient presents with   Shoulder Injury    HPI Michele Herrera is a 47 y.o. female.   HPI 69-year-old female presents with right-sided shoulder pain that radiates to the back of her neck for 2 weeks and getting worse.  Patient reports pain is intermittent and worse with certain movements.  Past Medical History:  Diagnosis Date   Anxiety 2000   I do not know an exact date   Arthritis    Bronchitis    Colon cancer screening 07/10/2021   GERD (gastroesophageal reflux disease)    Pneumonia    Seasonal allergies     Patient Active Problem List   Diagnosis Date Noted   Chest pain 04/08/2022   Chronic joint pain 07/10/2021   Other fatigue 07/10/2021   Colon cancer screening 07/10/2021   Nicotine dependence 07/10/2021   LUMBAR RADICULOPATHY 11/13/2008   BREAST MASS, LEFT 02/03/2008    Past Surgical History:  Procedure Laterality Date   TEMPOROMANDIBULAR JOINT SURGERY     TMJ surgeries     TONSILLECTOMY     TUBAL LIGATION      OB History   No obstetric history on file.      Home Medications    Prior to Admission medications   Medication Sig Start Date End Date Taking? Authorizing Provider  celecoxib (CELEBREX) 100 MG capsule Take 1 capsule (100 mg total) by mouth 2 (two) times daily for 15 days. 06/24/22 07/09/22 Yes Trevor Iha, FNP  predniSONE (STERAPRED UNI-PAK 21 TAB) 10 MG (21) TBPK tablet Take by mouth daily. Take 6 tabs by mouth daily  for 2 days, then 5 tabs for 2 days, then 4 tabs for 2 days, then 3 tabs for 2 days, 2 tabs for 2 days, then 1 tab by mouth daily for 2 days 06/24/22  Yes Trevor Iha, FNP    Family History Family History  Problem Relation Age of Onset   Cancer Mother    Hypertension Mother    Arthritis Mother    COPD Mother    Early death Mother    Hypertension Father    Cancer Maternal Grandmother     Cancer Maternal Grandfather    Hypertension Maternal Grandfather    Cancer Paternal Grandmother    Hypertension Paternal Grandmother    Heart disease Paternal Grandmother    Hearing loss Paternal Grandmother    Cancer Paternal Grandfather    Coronary artery disease Other    Cancer Other    Arthritis Other    ADD / ADHD Daughter     Social History Social History   Tobacco Use   Smoking status: Every Day    Types: E-cigarettes    Passive exposure: Never   Smokeless tobacco: Never  Vaping Use   Vaping Use: Every day  Substance Use Topics   Alcohol use: Yes    Comment: ocassional   Drug use: No     Allergies   Patient has no known allergies.   Review of Systems Review of Systems   Physical Exam Triage Vital Signs ED Triage Vitals  Enc Vitals Group     BP 06/24/22 1413 128/72     Pulse Rate 06/24/22 1413 66     Resp 06/24/22 1413 16     Temp 06/24/22 1413 98 F (36.7 C)     Temp Source 06/24/22  1413 Oral     SpO2 06/24/22 1413 99 %     Weight --      Height --      Head Circumference --      Peak Flow --      Pain Score 06/24/22 1414 3     Pain Loc --      Pain Edu? --      Excl. in GC? --    No data found.  Updated Vital Signs BP 128/72 (BP Location: Left Arm)   Pulse 66   Temp 98 F (36.7 C) (Oral)   Resp 16   LMP 06/04/2022   SpO2 99%       Physical Exam Vitals and nursing note reviewed.  Constitutional:      Appearance: Normal appearance. She is normal weight.  HENT:     Head: Normocephalic and atraumatic.     Mouth/Throat:     Mouth: Mucous membranes are moist.     Pharynx: Oropharynx is clear.  Eyes:     Extraocular Movements: Extraocular movements intact.     Conjunctiva/sclera: Conjunctivae normal.     Pupils: Pupils are equal, round, and reactive to light.  Cardiovascular:     Rate and Rhythm: Normal rate and regular rhythm.     Pulses: Normal pulses.     Heart sounds: Normal heart sounds. No murmur heard. Pulmonary:      Effort: Pulmonary effort is normal.     Breath sounds: Normal breath sounds. No wheezing, rhonchi or rales.  Musculoskeletal:        General: Normal range of motion.     Cervical back: Normal range of motion and neck supple.     Comments: Right shoulder: TTP over GH/AC joints, exam limited due to pain today  Skin:    General: Skin is warm and dry.  Neurological:     General: No focal deficit present.     Mental Status: She is alert and oriented to person, place, and time. Mental status is at baseline.      UC Treatments / Results  Labs (all labs ordered are listed, but only abnormal results are displayed) Labs Reviewed - No data to display  EKG   Radiology DG Cervical Spine Complete  Result Date: 06/24/2022 CLINICAL DATA:  Shoulder and neck pain EXAM: CERVICAL SPINE - COMPLETE 4+ VIEW COMPARISON:  None Available. FINDINGS: Normal alignment and prevertebral soft tissues. No acute osseous finding or fracture. Facets are aligned. Minimal degenerative disc disease at C5-6 with endplate bony spurring. Facets are aligned. Foramina widely patent. Intact odontoid. Trachea midline. Lung apices are clear. IMPRESSION: Minor C5-6 degenerative change. No acute finding by plain radiography. Electronically Signed   By: Judie PetitM.  Shick M.D.   On: 06/24/2022 15:16   DG Shoulder Right  Result Date: 06/24/2022 CLINICAL DATA:  Shoulder and neck pain EXAM: RIGHT SHOULDER - 2+ VIEW COMPARISON:  12/06/2018 FINDINGS: There is no evidence of fracture or dislocation. There is no evidence of arthropathy or other focal bone abnormality. Soft tissues are unremarkable. IMPRESSION: Negative. Electronically Signed   By: Judie PetitM.  Shick M.D.   On: 06/24/2022 15:14    Procedures Procedures (including critical care time)  Medications Ordered in UC Medications - No data to display  Initial Impression / Assessment and Plan / UC Course  I have reviewed the triage vital signs and the nursing notes.  Pertinent labs & imaging  results that were available during my care of the patient were reviewed by  me and considered in my medical decision making (see chart for details).     MDM: 1.  Right shoulder pain-right shoulder x-ray revealed above, Celebrex 100 mg twice daily x 15 days, Rx'd Sterapred Unipak tapering from 60 mg to 10 mg over 10 days; 2.  Neck pain-C-spine x-ray revealed above, Rx'd Celebrex 100 mg twice daily x 15 days.  2-day work note was provided to patient per request prior to discharge. Advised the patient of x-ray results with hard copies and images provided to patient.  Advised patient to take medications as directed with food to completion.  Advised patient to take prednisone with first dose of Celebrex for the next 10 to 15 days.  Encouraged increase daily water intake to 64 ounces per day while taking these medications.  Advised if symptoms worsen and/or unresolved please follow-up with PCP or here for further evaluation.  Patient discharged home, hemodynamically stable. Final Clinical Impressions(s) / UC Diagnoses   Final diagnoses:  Right shoulder pain, unspecified chronicity  Neck pain     Discharge Instructions      Advised the patient of x-ray results with hard copies and images provided to patient.  Advised patient to take medications as directed with food to completion.  Advised patient to take prednisone with first dose of Celebrex for the next 10 to 15 days.  Encouraged increase daily water intake to 64 ounces per day while taking these medications.  Advised if symptoms worsen and/or unresolved please follow-up with PCP or here for further evaluation.     ED Prescriptions     Medication Sig Dispense Auth. Provider   celecoxib (CELEBREX) 100 MG capsule Take 1 capsule (100 mg total) by mouth 2 (two) times daily for 15 days. 30 capsule Trevor Iha, FNP   predniSONE (STERAPRED UNI-PAK 21 TAB) 10 MG (21) TBPK tablet Take by mouth daily. Take 6 tabs by mouth daily  for 2 days, then 5 tabs  for 2 days, then 4 tabs for 2 days, then 3 tabs for 2 days, 2 tabs for 2 days, then 1 tab by mouth daily for 2 days 42 tablet Trevor Iha, FNP      PDMP not reviewed this encounter.   Trevor Iha, FNP 06/24/22 1549

## 2022-06-24 NOTE — ED Triage Notes (Signed)
Pt c/o right sided shoulder pain that radiates up her neck for the last 2 weeks but getting worse. States pain is intermittent and worse with certain movements. Denies known injury. Taking tylenol and ibuprofen.

## 2022-06-24 NOTE — Discharge Instructions (Addendum)
Advised the patient of x-ray results with hard copies and images provided to patient.  Advised patient to take medications as directed with food to completion.  Advised patient to take prednisone with first dose of Celebrex for the next 10 to 15 days.  Encouraged increase daily water intake to 64 ounces per day while taking these medications.  Advised if symptoms worsen and/or unresolved please follow-up with PCP or here for further evaluation.

## 2022-06-25 ENCOUNTER — Encounter: Payer: Self-pay | Admitting: Family Medicine

## 2022-06-29 ENCOUNTER — Encounter: Payer: Self-pay | Admitting: Family Medicine

## 2022-06-29 ENCOUNTER — Ambulatory Visit: Payer: 59 | Admitting: Family Medicine

## 2022-06-29 VITALS — BP 110/64 | HR 70 | Ht 60.0 in | Wt 189.0 lb

## 2022-06-29 DIAGNOSIS — M25512 Pain in left shoulder: Secondary | ICD-10-CM | POA: Diagnosis not present

## 2022-06-29 DIAGNOSIS — M25511 Pain in right shoulder: Secondary | ICD-10-CM | POA: Diagnosis not present

## 2022-06-29 MED ORDER — TIZANIDINE HCL 4 MG PO TABS: 4.0000 mg | ORAL_TABLET | Freq: Four times a day (QID) | ORAL | 1 refills | Status: DC | PRN

## 2022-06-29 NOTE — Assessment & Plan Note (Signed)
She may continue Celebrex and complete course of steroids.  Adding tizanidine.  Checking inflammatory markers as well as autoimmune markers especially given family history of rheumatoid arthritis.

## 2022-06-29 NOTE — Patient Instructions (Signed)
Continue celebrex Try adding tizanidine (muscle relaxer).

## 2022-06-29 NOTE — Progress Notes (Signed)
Michele Herrera - 47 y.o. female MRN [id number]  Date of birth: 1975/08/16  Subjective Chief Complaint  Patient presents with   Neck Pain   Shoulder Pain   Back Pain    HPI Michele Herrera is a 47 year old female here today with complaint of increasing neck pain and shoulder pain.  Her symptoms started approximately 2 weeks ago.  Initially started on the right side but is now affecting the left side as well.  She does feel some radiation down bilateral arms.  In addition to pain she reports some weakness.  Mild tingling sensation.  She was seen in urgent care on 410 and started on prednisone and Celebrex.  This has not really helped a whole lot.  She has not noted any joint swelling, fever or chills..  There is a family history of rheumatoid arthritis in her mother.  ROS:  A comprehensive ROS was completed and negative except as noted per HPI  No Known Allergies  Past Medical History:  Diagnosis Date   Anxiety 2000   I do not know an exact date   Arthritis    Bronchitis    Colon cancer screening 07/10/2021   GERD (gastroesophageal reflux disease)    Pneumonia    Seasonal allergies     Past Surgical History:  Procedure Laterality Date   TEMPOROMANDIBULAR JOINT SURGERY     TMJ surgeries     TONSILLECTOMY     TUBAL LIGATION      Social History   Socioeconomic History   Marital status: Divorced    Spouse name: Not on file   Number of children: 2   Years of education: Not on file   Highest education level: Not on file  Occupational History   Not on file  Tobacco Use   Smoking status: Every Day    Types: E-cigarettes    Passive exposure: Never   Smokeless tobacco: Never  Vaping Use   Vaping Use: Every day  Substance and Sexual Activity   Alcohol use: Yes    Comment: ocassional   Drug use: No   Sexual activity: Yes    Birth control/protection: None  Other Topics Concern   Not on file  Social History Narrative   Not on file   Social Determinants of Health    Financial Resource Strain: Not on file  Food Insecurity: Not on file  Transportation Needs: Not on file  Physical Activity: Not on file  Stress: Not on file  Social Connections: Not on file    Family History  Problem Relation Age of Onset   Cancer Mother    Hypertension Mother    Arthritis Mother    COPD Mother    Early death Mother    Hypertension Father    Cancer Maternal Grandmother    Cancer Maternal Grandfather    Hypertension Maternal Grandfather    Cancer Paternal Grandmother    Hypertension Paternal Grandmother    Heart disease Paternal Grandmother    Hearing loss Paternal Grandmother    Cancer Paternal Grandfather    Coronary artery disease Other    Cancer Other    Arthritis Other    ADD / ADHD Daughter     Health Maintenance  Topic Date Due   Hepatitis C Screening  Never done   PAP SMEAR-Modifier  10/16/2021   COVID-19 Vaccine (1) 12/15/2022 (Originally 11/20/1975)   COLONOSCOPY (Pts 45-48yrs Insurance coverage will need to be confirmed)  05/11/2023 (Originally 05/19/2020)   HIV Screening  09/06/2023 (  Originally 05/20/1990)   INFLUENZA VACCINE  10/15/2022   MAMMOGRAM  01/14/2023   HPV VACCINES  Aged Out   DTaP/Tdap/Td  Discontinued     ----------------------------------------------------------------------------------------------------------------------------------------------------------------------------------------------------------------- Physical Exam BP 110/64 (BP Location: Left Arm, Patient Position: Sitting, Cuff Size: Normal)   Pulse 70   Ht 5' (1.524 m)   Wt 189 lb (85.7 kg)   LMP 06/04/2022   SpO2 99%   BMI 36.91 kg/m   Physical Exam Constitutional:      Appearance: Normal appearance.  HENT:     Head: Normocephalic and atraumatic.  Eyes:     General: No scleral icterus. Cardiovascular:     Rate and Rhythm: Normal rate and regular rhythm.  Pulmonary:     Effort: Pulmonary effort is normal.     Breath sounds: Normal breath sounds.   Musculoskeletal:     Comments: Tenderness to palpation along the upper shoulders bilaterally right greater than left, trapezius and rhomboid area.  Range of motion of the neck is normal with some pain on full flexion.  Weakness with abduction.  Mild bilateral weakness of grip strength.  Neurological:     Mental Status: She is alert.  Psychiatric:        Mood and Affect: Mood normal.        Behavior: Behavior normal.     ------------------------------------------------------------------------------------------------------------------------------------------------------------------------------------------------------------------- Assessment and Plan  Acute pain of both shoulders She may continue Celebrex and complete course of steroids.  Adding tizanidine.  Checking inflammatory markers as well as autoimmune markers especially given family history of rheumatoid arthritis.   Meds ordered this encounter  Medications   tiZANidine (ZANAFLEX) 4 MG tablet    Sig: Take 1 tablet (4 mg total) by mouth every 6 (six) hours as needed for muscle spasms.    Dispense:  30 tablet    Refill:  1    No follow-ups on file.    This visit occurred during the SARS-CoV-2 public health emergency.  Safety protocols were in place, including screening questions prior to the visit, additional usage of staff PPE, and extensive cleaning of exam room while observing appropriate contact time as indicated for disinfecting solutions.

## 2022-07-02 ENCOUNTER — Other Ambulatory Visit: Payer: Self-pay | Admitting: Family Medicine

## 2022-07-02 DIAGNOSIS — M25511 Pain in right shoulder: Secondary | ICD-10-CM

## 2022-07-02 DIAGNOSIS — R768 Other specified abnormal immunological findings in serum: Secondary | ICD-10-CM

## 2022-07-03 ENCOUNTER — Encounter: Payer: Self-pay | Admitting: Family Medicine

## 2022-07-03 LAB — ANA,IFA RA DIAG PNL W/RFLX TIT/PATN
Anti Nuclear Antibody (ANA): POSITIVE — AB
Cyclic Citrullin Peptide Ab: <16 UNITS
Rheumatoid fact SerPl-aCnc: <10 IU/mL (ref <14)

## 2022-07-03 LAB — SEDIMENTATION RATE: Sed Rate: 2 mm/h (ref 0–20)

## 2022-07-03 LAB — ANTI-NUCLEAR AB-TITER (ANA TITER)
ANA TITER: 1:40 {titer} — ABNORMAL HIGH
ANA Titer 1: 1:320 {titer} — ABNORMAL HIGH

## 2022-07-03 LAB — C-REACTIVE PROTEIN: CRP: <3 mg/L (ref <8.0)

## 2022-07-03 LAB — CK: Total CK: 21 U/L — ABNORMAL LOW (ref 29–143)

## 2022-07-06 MED ORDER — PREDNISONE 5 MG (48) PO TBPK: ORAL_TABLET | ORAL | 0 refills | Status: DC

## 2022-07-06 MED ORDER — HYDROXYCHLOROQUINE SULFATE 200 MG PO TABS: 200.0000 mg | ORAL_TABLET | Freq: Every day | ORAL | 1 refills | Status: DC

## 2022-07-10 ENCOUNTER — Telehealth: Payer: Self-pay | Admitting: Family Medicine

## 2022-07-10 NOTE — Telephone Encounter (Signed)
Pt called. She is wants to know if we got Disability claim from [facility]? She believes it was faxed the beginning of the week(I don't see any paperwork up front for her)

## 2022-07-14 ENCOUNTER — Telehealth: Payer: Self-pay

## 2022-07-14 NOTE — Telephone Encounter (Signed)
No disability paperwork has been received for her.    CM

## 2022-07-14 NOTE — Telephone Encounter (Signed)
Patient dropped off paperwork for disability forms to be filled out. Paperwork placed in basket. tvt

## 2022-07-17 ENCOUNTER — Encounter: Payer: Self-pay | Admitting: Family Medicine

## 2022-07-17 ENCOUNTER — Other Ambulatory Visit: Payer: Self-pay | Admitting: Family Medicine

## 2022-07-17 DIAGNOSIS — M25511 Pain in right shoulder: Secondary | ICD-10-CM

## 2022-07-17 DIAGNOSIS — R768 Other specified abnormal immunological findings in serum: Secondary | ICD-10-CM

## 2022-07-17 NOTE — Telephone Encounter (Signed)
Forms completed.  Placed in Panya's box to be faxed in.

## 2022-07-20 NOTE — Telephone Encounter (Signed)
Forms were given to Weston Brass to fax on Friday.  Can you refax please.    Thanks!

## 2022-07-29 ENCOUNTER — Ambulatory Visit: Payer: 59 | Admitting: Family Medicine

## 2022-07-29 ENCOUNTER — Encounter: Payer: Self-pay | Admitting: Family Medicine

## 2022-07-29 VITALS — BP 126/76 | HR 72 | Ht 60.0 in | Wt 191.0 lb

## 2022-07-29 DIAGNOSIS — M329 Systemic lupus erythematosus, unspecified: Secondary | ICD-10-CM | POA: Insufficient documentation

## 2022-07-29 DIAGNOSIS — H539 Unspecified visual disturbance: Secondary | ICD-10-CM | POA: Diagnosis not present

## 2022-07-29 NOTE — Addendum Note (Signed)
Addended by: Mammie Lorenzo on: 07/29/2022 02:28 PM   Modules accepted: Orders

## 2022-07-29 NOTE — Assessment & Plan Note (Signed)
She is seeing rheumatology.  Continues on plaquenil, prednisone and celebrex.  Still with pain and it is my medical opinion that she is unable to work until her symptoms are improving.  Will write out until at least 09/03/22.

## 2022-07-29 NOTE — Progress Notes (Addendum)
Michele Herrera - 47 y.o. female MRN [id number]  Date of birth: [birth year]  Subjective Chief Complaint  Patient presents with   Lupus    HPI Michele Herrera is a 47 y.o. female here today for follow up of joint pain.  Recent ANA titers with 1:320 nuclear, homogenous pattern.  She was referred to Rheumatology however could not be seen until the Fall. I had started her on steroid as well as plaquenil.  She was able to get in with another Rheumatologist @ [facility] and [facility].   Dr. Lanell Matar continued plaquenil.  Celebrex and additional steroids were added.  She reports that she is still having quite a bit of pain.  She has follow up with rheumatology later this week.  She needs an updated note for her job.   Pain mostly in shoulder and arms but also having some pain in the L knee.  She has also noted some vision changes.  This started prior to adding plaquenil.    ROS:  A comprehensive ROS was completed and negative except as noted per HPI    No Known Allergies  Past Medical History:  Diagnosis Date   Anxiety 2000   I do not know an exact date   Arthritis    Bronchitis    Colon cancer screening 07/10/2021   GERD (gastroesophageal reflux disease)    Pneumonia    Seasonal allergies     Past Surgical History:  Procedure Laterality Date   TEMPOROMANDIBULAR JOINT SURGERY     TMJ surgeries     TONSILLECTOMY     TUBAL LIGATION      Social History   Socioeconomic History   Marital status: Divorced    Spouse name: Not on file   Number of children: 2   Years of education: Not on file   Highest education level: Associate degree: academic program  Occupational History   Not on file  Tobacco Use   Smoking status: Every Day    Types: E-cigarettes    Passive exposure: Never   Smokeless tobacco: Never  Vaping Use   Vaping Use: Every day  Substance and Sexual Activity   Alcohol use: Yes    Comment: ocassional   Drug use: No   Sexual activity: Yes    Birth  control/protection: None  Other Topics Concern   Not on file  Social History Narrative   Not on file   Social Determinants of Health   Financial Resource Strain: Medium Risk (07/25/2022)   Overall Financial Resource Strain (CARDIA)    Difficulty of Paying Living Expenses: Somewhat hard  Food Insecurity: Food Insecurity Present (07/25/2022)   Hunger Vital Sign    Worried About Running Out of Food in the Last Year: Sometimes true    Ran Out of Food in the Last Year: Sometimes true  Transportation Needs: No Transportation Needs (07/25/2022)   PRAPARE - Administrator, Civil Service (Medical): No    Lack of Transportation (Non-Medical): No  Physical Activity: Unknown (07/25/2022)   Exercise Vital Sign    Days of Exercise per Week: 0 days    Minutes of Exercise per Session: Not on file  Stress: Stress Concern Present (07/25/2022)   [employer] of Occupational Health - Occupational Stress Questionnaire    Feeling of Stress : Very much  Social Connections: Socially Isolated (07/25/2022)   Social Connection and Isolation Panel [NHANES]    Frequency of Communication with Friends and Family: Once a week  Frequency of Social Gatherings with Friends and Family: Never    Attends Religious Services: Never    Database administrator or Organizations: No    Attends Engineer, structural: Not on file    Marital Status: Divorced    Family History  Problem Relation Age of Onset   Cancer Mother    Hypertension Mother    Arthritis Mother    COPD Mother    Early death Mother    Hypertension Father    Cancer Maternal Grandmother    Cancer Maternal Grandfather    Hypertension Maternal Grandfather    Cancer Paternal Grandmother    Hypertension Paternal Grandmother    Heart disease Paternal Grandmother    Hearing loss Paternal Grandmother    Cancer Paternal Grandfather    Coronary artery disease Other    Cancer Other    Arthritis Other    ADD / ADHD Daughter      Health Maintenance  Topic Date Due   PAP SMEAR-Modifier  11/01/2022 (Originally 10/16/2021)   COVID-19 Vaccine (1) 12/15/2022 (Originally 11/20/1975)   COLONOSCOPY (Pts 45-72yrs Insurance coverage will need to be confirmed)  05/11/2023 (Originally 05/19/2020)   HIV Screening  09/06/2023 (Originally 05/20/1990)   Hepatitis C Screening  11/03/2023 (Originally 05/19/1993)   INFLUENZA VACCINE  10/15/2022   MAMMOGRAM  01/14/2023   HPV VACCINES  Aged Out   DTaP/Tdap/Td  Discontinued     ----------------------------------------------------------------------------------------------------------------------------------------------------------------------------------------------------------------- Physical Exam BP 126/76 (BP Location: Left Arm, Patient Position: Sitting, Cuff Size: Normal)   Pulse 72   Ht 5' (1.524 m)   Wt 191 lb (86.6 kg)   SpO2 100%   BMI 37.30 kg/m   Physical Exam Constitutional:      Appearance: Normal appearance.  Eyes:     General: No scleral icterus. Neurological:     General: No focal deficit present.     Mental Status: She is alert and oriented to person, place, and time.  Psychiatric:        Mood and Affect: Mood normal.        Behavior: Behavior normal.     ------------------------------------------------------------------------------------------------------------------------------------------------------------------------------------------------------------------- Assessment and Plan  SLE (systemic lupus erythematosus related syndrome) (HCC) She is seeing rheumatology.  Continues on plaquenil, prednisone and celebrex.  Still with pain and it is my medical opinion that she is unable to work until her symptoms are improving.  Will write out until at least 09/03/22.     No orders of the defined types were placed in this encounter.   Return in about 5 weeks (around 09/02/2022) for F/u SLE.    This visit occurred during the SARS-CoV-2 public health  emergency.  Safety protocols were in place, including screening questions prior to the visit, additional usage of staff PPE, and extensive cleaning of exam room while observing appropriate contact time as indicated for disinfecting solutions.

## 2022-09-02 ENCOUNTER — Other Ambulatory Visit: Payer: Self-pay | Admitting: Family Medicine

## 2022-09-02 ENCOUNTER — Ambulatory Visit: Payer: 59 | Admitting: Family Medicine

## 2022-09-02 ENCOUNTER — Encounter: Payer: Self-pay | Admitting: Family Medicine

## 2022-09-02 VITALS — BP 122/78 | HR 75 | Ht 65.0 in | Wt 196.0 lb

## 2022-09-02 DIAGNOSIS — M329 Systemic lupus erythematosus, unspecified: Secondary | ICD-10-CM | POA: Diagnosis not present

## 2022-09-02 DIAGNOSIS — G8929 Other chronic pain: Secondary | ICD-10-CM

## 2022-09-02 DIAGNOSIS — F4323 Adjustment disorder with mixed anxiety and depressed mood: Secondary | ICD-10-CM | POA: Diagnosis not present

## 2022-09-02 DIAGNOSIS — F4320 Adjustment disorder, unspecified: Secondary | ICD-10-CM | POA: Insufficient documentation

## 2022-09-02 DIAGNOSIS — M2550 Pain in unspecified joint: Secondary | ICD-10-CM | POA: Diagnosis not present

## 2022-09-02 MED ORDER — DULOXETINE HCL 30 MG PO CPEP: 30.0000 mg | ORAL_CAPSULE | Freq: Every day | ORAL | 3 refills | Status: DC

## 2022-09-02 MED ORDER — TRAMADOL HCL 50 MG PO TABS: 50.0000 mg | ORAL_TABLET | Freq: Three times a day (TID) | ORAL | 0 refills | Status: DC | PRN

## 2022-09-02 MED ORDER — TIZANIDINE HCL 4 MG PO TABS: 4.0000 mg | ORAL_TABLET | Freq: Four times a day (QID) | ORAL | 1 refills | Status: DC | PRN

## 2022-09-02 NOTE — Assessment & Plan Note (Signed)
She continues to have pain associated with her SLE.  Continue plaquenil with prednisone as directed by rheumatology.  Short term tramadol to help with pain control, instructed to use sparingly only for severe pain.  She is unable to work at this time due to her condition and I recommended that she remain out of work until she is seen by rheumatology again on 10/27/22.

## 2022-09-02 NOTE — Patient Instructions (Addendum)
Start duloxetine 30mg  daily.  Tramadol as needed for severe pain See me again in about 6 weeks.

## 2022-09-02 NOTE — Assessment & Plan Note (Signed)
Adding duloxetine as I think this may help with her mood and her chronic pain.  F/u in 6 weeks.

## 2022-09-02 NOTE — Progress Notes (Signed)
Michele Herrera - 47 y.o. female MRN [id number]  Date of birth: 03/29/1975  Subjective Chief Complaint  Patient presents with   Balance    HPI Michele Herrera is a 47 y.o. female here today for follow up.   Seeing rheumatology for autoimmune process, diagnosed as SLE.  Remains on plaquenil, prednisone and celebrex.  She has been out of work for a few weeks while trying to get this flare under better control.  She reports that she continues to have quite a bit of pain. She will have 1 or 2 good days and then 3-4 bad days.  Celebrex and tylenol are not adequately controlling her pain at this time.  She does not see rheumatology again until August. She is having some pain in her back and leg as well.  She feels that this is starting to affect her mental health some as well.  Feels more anxious with some depressive symptoms.   ROS:  A comprehensive ROS was completed and negative except as noted per HPI  No Known Allergies  Past Medical History:  Diagnosis Date   Anxiety 2000   I do not know an exact date   Arthritis    Bronchitis    Colon cancer screening 07/10/2021   GERD (gastroesophageal reflux disease)    Pneumonia    Seasonal allergies     Past Surgical History:  Procedure Laterality Date   TEMPOROMANDIBULAR JOINT SURGERY     TMJ surgeries     TONSILLECTOMY     TUBAL LIGATION      Social History   Socioeconomic History   Marital status: Divorced    Spouse name: Not on file   Number of children: 2   Years of education: Not on file   Highest education level: Associate degree: academic program  Occupational History   Not on file  Tobacco Use   Smoking status: Every Day    Types: E-cigarettes    Passive exposure: Never   Smokeless tobacco: Never  Vaping Use   Vaping Use: Every day  Substance and Sexual Activity   Alcohol use: Yes    Comment: ocassional   Drug use: No   Sexual activity: Yes    Birth control/protection: None  Other Topics Concern   Not on  file  Social History Narrative   Not on file   Social Determinants of Health   Financial Resource Strain: Medium Risk (07/25/2022)   Overall Financial Resource Strain (CARDIA)    Difficulty of Paying Living Expenses: Somewhat hard  Food Insecurity: Food Insecurity Present (07/25/2022)   Hunger Vital Sign    Worried About Running Out of Food in the Last Year: Sometimes true    Ran Out of Food in the Last Year: Sometimes true  Transportation Needs: No Transportation Needs (07/25/2022)   PRAPARE - Administrator, Civil Service (Medical): No    Lack of Transportation (Non-Medical): No  Physical Activity: Unknown (07/25/2022)   Exercise Vital Sign    Days of Exercise per Week: 0 days    Minutes of Exercise per Session: Not on file  Stress: Stress Concern Present (07/25/2022)   [employer] of Occupational Health - Occupational Stress Questionnaire    Feeling of Stress : Very much  Social Connections: Socially Isolated (07/25/2022)   Social Connection and Isolation Panel [NHANES]    Frequency of Communication with Friends and Family: Once a week    Frequency of Social Gatherings with Friends and Family: Never  Attends Religious Services: Never    Active Member of Clubs or Organizations: No    Attends Engineer, structural: Not on file    Marital Status: Divorced    Family History  Problem Relation Age of Onset   Cancer Mother    Hypertension Mother    Arthritis Mother    COPD Mother    Early death Mother    Hypertension Father    Cancer Maternal Grandmother    Cancer Maternal Grandfather    Hypertension Maternal Grandfather    Cancer Paternal Grandmother    Hypertension Paternal Grandmother    Heart disease Paternal Grandmother    Hearing loss Paternal Grandmother    Cancer Paternal Grandfather    Coronary artery disease Other    Cancer Other    Arthritis Other    ADD / ADHD Daughter     Health Maintenance  Topic Date Due   PAP SMEAR-Modifier   11/01/2022 (Originally 10/16/2021)   COVID-19 Vaccine (1) 12/15/2022 (Originally 11/20/1975)   HIV Screening  09/06/2023 (Originally 05/20/1990)   Hepatitis C Screening  11/03/2023 (Originally 05/19/1993)   INFLUENZA VACCINE  10/15/2022   MAMMOGRAM  01/14/2023   Colonoscopy  10/09/2031   HPV VACCINES  Aged Out   DTaP/Tdap/Td  Discontinued     ----------------------------------------------------------------------------------------------------------------------------------------------------------------------------------------------------------------- Physical Exam BP 122/78 (BP Location: Left Arm, Patient Position: Sitting, Cuff Size: Normal)   Pulse 75   Ht 5\' 5"  (1.651 m)   Wt 196 lb (88.9 kg)   SpO2 100%   BMI 32.62 kg/m   Physical Exam Constitutional:      Appearance: Normal appearance.  HENT:     Head: Normocephalic and atraumatic.  Eyes:     General: No scleral icterus. Neurological:     General: No focal deficit present.     Mental Status: She is alert.  Psychiatric:        Mood and Affect: Mood normal.        Behavior: Behavior normal.     ------------------------------------------------------------------------------------------------------------------------------------------------------------------------------------------------------------------- Assessment and Plan  SLE (systemic lupus erythematosus related syndrome) (HCC) She continues to have pain associated with her SLE.  Continue plaquenil with prednisone as directed by rheumatology.  Short term tramadol to help with pain control, instructed to use sparingly only for severe pain.  She is unable to work at this time due to her condition and I recommended that she remain out of work until she is seen by rheumatology again on 10/27/22.   Adjustment disorder Adding duloxetine as I think this may help with her mood and her chronic pain.  F/u in 6 weeks.    Meds ordered this encounter  Medications   tiZANidine  (ZANAFLEX) 4 MG tablet    Sig: Take 1 tablet (4 mg total) by mouth every 6 (six) hours as needed for muscle spasms.    Dispense:  30 tablet    Refill:  1   DULoxetine (CYMBALTA) 30 MG capsule    Sig: Take 1 capsule (30 mg total) by mouth daily.    Dispense:  30 capsule    Refill:  3   traMADol (ULTRAM) 50 MG tablet    Sig: Take 1 tablet (50 mg total) by mouth every 8 (eight) hours as needed for up to 5 days.    Dispense:  15 tablet    Refill:  0    Return in about 6 weeks (around 10/14/2022) for F/u Mood/Pain.    This visit occurred during the SARS-CoV-2 public health emergency.  Safety  protocols were in place, including screening questions prior to the visit, additional usage of staff PPE, and extensive cleaning of exam room while observing appropriate contact time as indicated for disinfecting solutions.

## 2022-09-08 ENCOUNTER — Encounter: Payer: Self-pay | Admitting: Family Medicine

## 2022-09-23 ENCOUNTER — Other Ambulatory Visit: Payer: Self-pay | Admitting: Family Medicine

## 2022-09-23 ENCOUNTER — Encounter: Payer: Self-pay | Admitting: Family Medicine

## 2022-09-25 ENCOUNTER — Other Ambulatory Visit: Payer: Self-pay | Admitting: Family Medicine

## 2022-10-12 ENCOUNTER — Other Ambulatory Visit: Payer: Self-pay | Admitting: Family Medicine

## 2022-10-14 ENCOUNTER — Ambulatory Visit: Payer: 59 | Admitting: Family Medicine

## 2022-10-27 ENCOUNTER — Encounter: Payer: Self-pay | Admitting: Family Medicine

## 2022-10-27 ENCOUNTER — Ambulatory Visit: Payer: 59 | Admitting: Family Medicine

## 2022-10-27 VITALS — BP 129/81 | HR 75 | Ht 65.0 in | Wt 200.0 lb

## 2022-10-27 DIAGNOSIS — M329 Systemic lupus erythematosus, unspecified: Secondary | ICD-10-CM | POA: Diagnosis not present

## 2022-10-27 DIAGNOSIS — N939 Abnormal uterine and vaginal bleeding, unspecified: Secondary | ICD-10-CM | POA: Diagnosis not present

## 2022-10-27 DIAGNOSIS — Z124 Encounter for screening for malignant neoplasm of cervix: Secondary | ICD-10-CM

## 2022-10-28 DIAGNOSIS — N939 Abnormal uterine and vaginal bleeding, unspecified: Secondary | ICD-10-CM | POA: Insufficient documentation

## 2022-10-28 NOTE — Progress Notes (Signed)
Michele Herrera - 47 y.o. female MRN [id number]  Date of birth: December 17, 1975  Subjective Chief Complaint  Patient presents with   Vaginal Bleeding    HPI Michele Herrera is a 47 year old female here today for follow-up visit.  She has had significant muscle and joint pain over the past few months.  Laboratory testing did not reveal elevated ANA.  She was referred to rheumatology and reports she was initially told that she had lupus and eliquis continue to stay on Plaquenil and prednisone was added.  She reports that upon her follow-up she was not told anything different it was recommended that she continue current medications.  However while filing for short-term disability due to continued pain there is some discrepancy as to whether she truly has lupus.  Documentation from rheumatologist on her second visit seem to conflict with what she has been told as this documentation does not seem to support diagnosis of lupus.  She has continued on Plaquenil as well as steroids and reports that she still has an additional refill of steroids and was never told to discontinue this.  Unfortunately, she has been unable to work due to her ongoing pain.  She does have an appointment with another rheumatologist next month for additional opinion.  She is also having some issues with vaginal bleeding recently.  She reports that she has had heavy bleeding over the past week.  She thought she may be going through menopause as her menstrual cycles have been irregular and spaced out.  She denies any increased cramping or pain.  She is due for Pap this year.  ROS:  A comprehensive ROS was completed and negative except as noted per HPI  No Known Allergies  Past Medical History:  Diagnosis Date   Anxiety 2000   I do not know an exact date   Arthritis    Bronchitis    Colon cancer screening 07/10/2021   GERD (gastroesophageal reflux disease)    Pneumonia    Seasonal allergies     Past Surgical History:  Procedure  Laterality Date   TEMPOROMANDIBULAR JOINT SURGERY     TMJ surgeries     TONSILLECTOMY     TUBAL LIGATION      Social History   Socioeconomic History   Marital status: Divorced    Spouse name: Not on file   Number of children: 2   Years of education: Not on file   Highest education level: Associate degree: academic program  Occupational History   Not on file  Tobacco Use   Smoking status: Every Day    Types: E-cigarettes    Passive exposure: Never   Smokeless tobacco: Never  Vaping Use   Vaping status: Every Day  Substance and Sexual Activity   Alcohol use: Yes    Comment: ocassional   Drug use: No   Sexual activity: Yes    Birth control/protection: None  Other Topics Concern   Not on file  Social History Narrative   Not on file   Social Determinants of Health   Financial Resource Strain: Medium Risk (07/25/2022)   Overall Financial Resource Strain (CARDIA)    Difficulty of Paying Living Expenses: Somewhat hard  Food Insecurity: Food Insecurity Present (07/25/2022)   Hunger Vital Sign    Worried About Running Out of Food in the Last Year: Sometimes true    Ran Out of Food in the Last Year: Sometimes true  Transportation Needs: No Transportation Needs (07/25/2022)   PRAPARE - Transportation  Lack of Transportation (Medical): No    Lack of Transportation (Non-Medical): No  Physical Activity: Unknown (07/25/2022)   Exercise Vital Sign    Days of Exercise per Week: 0 days    Minutes of Exercise per Session: Not on file  Stress: Stress Concern Present (07/25/2022)   [employer] of Occupational Health - Occupational Stress Questionnaire    Feeling of Stress : Very much  Social Connections: Socially Isolated (07/25/2022)   Social Connection and Isolation Panel [NHANES]    Frequency of Communication with Friends and Family: Once a week    Frequency of Social Gatherings with Friends and Family: Never    Attends Religious Services: Never    Database administrator or  Organizations: No    Attends Engineer, structural: Not on file    Marital Status: Divorced    Family History  Problem Relation Age of Onset   Cancer Mother    Hypertension Mother    Arthritis Mother    COPD Mother    Early death Mother    Hypertension Father    Cancer Maternal Grandmother    Cancer Maternal Grandfather    Hypertension Maternal Grandfather    Cancer Paternal Grandmother    Hypertension Paternal Grandmother    Heart disease Paternal Grandmother    Hearing loss Paternal Grandmother    Cancer Paternal Grandfather    Coronary artery disease Other    Cancer Other    Arthritis Other    ADD / ADHD Daughter     Health Maintenance  Topic Date Due   PAP SMEAR-Modifier  11/01/2022 (Originally 10/16/2021)   COVID-19 Vaccine (1 - 2023-24 season) 12/15/2022 (Originally 11/14/2021)   INFLUENZA VACCINE  06/14/2023 (Originally 10/15/2022)   HIV Screening  09/06/2023 (Originally 05/20/1990)   Hepatitis C Screening  11/03/2023 (Originally 05/19/1993)   MAMMOGRAM  01/14/2023   Colonoscopy  10/09/2031   HPV VACCINES  Aged Out   DTaP/Tdap/Td  Discontinued     ----------------------------------------------------------------------------------------------------------------------------------------------------------------------------------------------------------------- Physical Exam BP 129/81 (BP Location: Left Arm, Patient Position: Sitting, Cuff Size: Large)   Pulse 75   Ht 5\' 5"  (1.651 m)   Wt 200 lb (90.7 kg)   SpO2 100%   BMI 33.28 kg/m   Physical Exam Constitutional:      Appearance: Normal appearance.  HENT:     Head: Normocephalic and atraumatic.  Eyes:     General: No scleral icterus. Cardiovascular:     Rate and Rhythm: Normal rate and regular rhythm.  Pulmonary:     Effort: Pulmonary effort is normal.     Breath sounds: Normal breath sounds.  Musculoskeletal:     Cervical back: Neck supple.  Neurological:     Mental Status: She is alert.   Psychiatric:        Mood and Affect: Mood normal.        Behavior: Behavior normal.     ------------------------------------------------------------------------------------------------------------------------------------------------------------------------------------------------------------------- Assessment and Plan  Abnormal uterine bleeding (AUB) Referral placed to GYN.  SLE (systemic lupus erythematosus related syndrome) (HCC) Sounds like there has been contradictions to what she has been told versus what documentation supports.  Will request records from her last visit with rheumatology.  She does have an upcoming visit with another rheumatologist for second opinion.   No orders of the defined types were placed in this encounter.   No follow-ups on file.    This visit occurred during the SARS-CoV-2 public health emergency.  Safety protocols were in place, including screening questions prior to the  visit, additional usage of staff PPE, and extensive cleaning of exam room while observing appropriate contact time as indicated for disinfecting solutions.

## 2022-10-28 NOTE — Assessment & Plan Note (Signed)
Sounds like there has been contradictions to what she has been told versus what documentation supports.  Will request records from her last visit with rheumatology.  She does have an upcoming visit with another rheumatologist for second opinion.

## 2022-10-28 NOTE — Assessment & Plan Note (Signed)
Referral placed to GYN 

## 2022-11-09 ENCOUNTER — Other Ambulatory Visit: Payer: Self-pay | Admitting: Family Medicine

## 2022-11-09 DIAGNOSIS — G8929 Other chronic pain: Secondary | ICD-10-CM

## 2022-11-18 ENCOUNTER — Encounter (INDEPENDENT_AMBULATORY_CARE_PROVIDER_SITE_OTHER): Payer: 59 | Admitting: Family Medicine

## 2022-11-18 DIAGNOSIS — M329 Systemic lupus erythematosus, unspecified: Secondary | ICD-10-CM | POA: Diagnosis not present

## 2022-11-20 NOTE — Telephone Encounter (Signed)
Please see the MyChart message reply(ies) for my assessment and plan.    This patient gave consent for this Medical Advice Message and is aware that it may result in a bill to their insurance company, as well as the possibility of receiving a bill for a co-payment or deductible. They are an established patient, but are not seeking medical advice exclusively about a problem treated during an in person or video visit in the last seven days. I did not recommend an in person or video visit within seven days of my reply.    I spent a total of 6 minutes cumulative time within 7 days through MyChart messaging.  Cody Matthews, DO   

## 2022-12-03 ENCOUNTER — Encounter: Payer: 59 | Admitting: Rheumatology

## 2022-12-08 ENCOUNTER — Encounter: Payer: Self-pay | Admitting: Family Medicine

## 2022-12-17 ENCOUNTER — Ambulatory Visit: Payer: 59 | Admitting: Rheumatology

## 2022-12-22 ENCOUNTER — Other Ambulatory Visit: Payer: Self-pay | Admitting: Family Medicine

## 2022-12-29 ENCOUNTER — Encounter: Payer: Self-pay | Admitting: Family Medicine

## 2022-12-29 NOTE — Telephone Encounter (Signed)
Patient scheduled with Dr. Ashley Royalty tomorrow 12/30/22 @ 10:30 am

## 2022-12-30 ENCOUNTER — Ambulatory Visit: Payer: 59 | Admitting: Family Medicine

## 2022-12-30 ENCOUNTER — Encounter: Payer: Self-pay | Admitting: Family Medicine

## 2022-12-30 VITALS — BP 144/78 | HR 75 | Ht 65.0 in | Wt 206.0 lb

## 2022-12-30 DIAGNOSIS — M329 Systemic lupus erythematosus, unspecified: Secondary | ICD-10-CM | POA: Diagnosis not present

## 2022-12-30 MED ORDER — PREDNISONE 10 MG (48) PO TBPK: ORAL_TABLET | ORAL | 0 refills | Status: DC

## 2022-12-30 MED ORDER — ONDANSETRON 4 MG PO TBDP: 4.0000 mg | ORAL_TABLET | Freq: Three times a day (TID) | ORAL | 0 refills | Status: DC | PRN

## 2022-12-30 NOTE — Progress Notes (Signed)
Michele Herrera - 47 y.o. female MRN [id number]  Date of birth: 07-May-1975  Subjective Chief Complaint  Patient presents with   Joint Swelling   Fatigue    HPI Michele Herrera is a 47 y.o. female here today for follow up.   Reports that she is having a flare of her pain.  Noticed increased joint swelling and stiffness.  Pain worse in the knee.  Also with rash on the inside of the thighs.  She started prednisone 10mg  yesterday and has noticed some improvement of symptoms today.  She has been out of work due to her flare.  She has seen rheumatology for this and reports that she was initially told that she had SLE but then notes seem to indicate she may not have SLE.  She has appt with a different rheumatologist in February.   ROS:  A comprehensive ROS was completed and negative except as noted per HPI  No Known Allergies  Past Medical History:  Diagnosis Date   Anxiety 2000   I do not know an exact date   Arthritis    Bronchitis    Colon cancer screening 07/10/2021   GERD (gastroesophageal reflux disease)    Pneumonia    Seasonal allergies     Past Surgical History:  Procedure Laterality Date   TEMPOROMANDIBULAR JOINT SURGERY     TMJ surgeries     TONSILLECTOMY     TUBAL LIGATION      Social History   Socioeconomic History   Marital status: Divorced    Spouse name: Not on file   Number of children: 2   Years of education: Not on file   Highest education level: Associate degree: occupational, Scientist, product/process development, or vocational program  Occupational History   Not on file  Tobacco Use   Smoking status: Every Day    Types: E-cigarettes    Passive exposure: Never   Smokeless tobacco: Never  Vaping Use   Vaping status: Every Day  Substance and Sexual Activity   Alcohol use: Yes    Comment: ocassional   Drug use: No   Sexual activity: Yes    Birth control/protection: None  Other Topics Concern   Not on file  Social History Narrative   Not on file   Social  Determinants of Health   Financial Resource Strain: Medium Risk (12/29/2022)   Overall Financial Resource Strain (CARDIA)    Difficulty of Paying Living Expenses: Somewhat hard  Food Insecurity: Food Insecurity Present (12/29/2022)   Hunger Vital Sign    Worried About Running Out of Food in the Last Year: Sometimes true    Ran Out of Food in the Last Year: Sometimes true  Transportation Needs: No Transportation Needs (12/29/2022)   PRAPARE - Administrator, Civil Service (Medical): No    Lack of Transportation (Non-Medical): No  Physical Activity: Insufficiently Active (12/29/2022)   Exercise Vital Sign    Days of Exercise per Week: 3 days    Minutes of Exercise per Session: 30 min  Stress: Stress Concern Present (12/29/2022)   [employer] of Occupational Health - Occupational Stress Questionnaire    Feeling of Stress : To some extent  Social Connections: Socially Isolated (12/29/2022)   Social Connection and Isolation Panel [NHANES]    Frequency of Communication with Friends and Family: Never    Frequency of Social Gatherings with Friends and Family: Never    Attends Religious Services: Never    Database administrator or Organizations: No  Attends Banker Meetings: Not on file    Marital Status: Divorced    Family History  Problem Relation Age of Onset   Cancer Mother    Hypertension Mother    Arthritis Mother    COPD Mother    Early death Mother    Hypertension Father    Cancer Maternal Grandmother    Cancer Maternal Grandfather    Hypertension Maternal Grandfather    Cancer Paternal Grandmother    Hypertension Paternal Grandmother    Heart disease Paternal Grandmother    Hearing loss Paternal Grandmother    Cancer Paternal Grandfather    Coronary artery disease Other    Cancer Other    Arthritis Other    ADD / ADHD Daughter     Health Maintenance  Topic Date Due   Cervical Cancer Screening (HPV/Pap Cotest)  Never done    COVID-19 Vaccine (1 - 2023-24 season) Never done   INFLUENZA VACCINE  06/14/2023 (Originally 10/15/2022)   HIV Screening  09/06/2023 (Originally 05/20/1990)   Hepatitis C Screening  11/03/2023 (Originally 05/19/1993)   MAMMOGRAM  01/14/2023   Colonoscopy  10/09/2031   HPV VACCINES  Aged Out   DTaP/Tdap/Td  Discontinued     ----------------------------------------------------------------------------------------------------------------------------------------------------------------------------------------------------------------- Physical Exam BP (!) 144/78 (BP Location: Left Arm, Patient Position: Sitting, Cuff Size: Large)   Pulse 75   Ht 5\' 5"  (1.651 m)   Wt 206 lb (93.4 kg)   SpO2 99%   BMI 34.28 kg/m   Physical Exam Constitutional:      Appearance: Normal appearance.  Eyes:     General: No scleral icterus. Cardiovascular:     Rate and Rhythm: Normal rate and regular rhythm.  Pulmonary:     Effort: Pulmonary effort is normal.     Breath sounds: Normal breath sounds.  Neurological:     Mental Status: She is alert.  Psychiatric:        Mood and Affect: Mood normal.        Behavior: Behavior normal.     ------------------------------------------------------------------------------------------------------------------------------------------------------------------------------------------------------------------- Assessment and Plan  SLE (systemic lupus erythematosus related syndrome) (HCC) Adding prednisone taper.  She will plan to keep appt with rheumatology.  Contact clinic if worsening.    Meds ordered this encounter  Medications   predniSONE (STERAPRED UNI-PAK 48 TAB) 10 MG (48) TBPK tablet    Sig: Taper as directed on packaging.    Dispense:  48 tablet    Refill:  0   ondansetron (ZOFRAN-ODT) 4 MG disintegrating tablet    Sig: Take 1 tablet (4 mg total) by mouth every 8 (eight) hours as needed for nausea or vomiting.    Dispense:  20 tablet    Refill:  0    No  follow-ups on file.    This visit occurred during the SARS-CoV-2 public health emergency.  Safety protocols were in place, including screening questions prior to the visit, additional usage of staff PPE, and extensive cleaning of exam room while observing appropriate contact time as indicated for disinfecting solutions.

## 2022-12-30 NOTE — Assessment & Plan Note (Signed)
Adding prednisone taper.  She will plan to keep appt with rheumatology.  Contact clinic if worsening.

## 2022-12-30 NOTE — Patient Instructions (Signed)
Start prednisone taper.   Try zofran for nausea.  Let me know if worsening.

## 2023-01-06 ENCOUNTER — Other Ambulatory Visit: Payer: Self-pay | Admitting: Family Medicine

## 2023-01-06 DIAGNOSIS — G8929 Other chronic pain: Secondary | ICD-10-CM

## 2023-01-11 ENCOUNTER — Other Ambulatory Visit: Payer: Self-pay | Admitting: Family Medicine

## 2023-02-16 ENCOUNTER — Other Ambulatory Visit: Payer: Self-pay | Admitting: Family Medicine

## 2023-03-14 ENCOUNTER — Other Ambulatory Visit: Payer: Self-pay | Admitting: Family Medicine

## 2023-03-14 DIAGNOSIS — G8929 Other chronic pain: Secondary | ICD-10-CM

## 2023-04-09 NOTE — Progress Notes (Signed)
 Office Visit Note  Patient: Michele Herrera             Date of Birth: [birth year]           MRN: [id number]             PCP: Alvia Bring, DO Referring: Alvia Bring, DO Visit Date: 04/23/2023 Occupation: @GUAROCC @  Subjective:  Pain in multiple joints  History of Present Illness: Michele Herrera is a 48 y.o. female seen in consultation per request of Dr. Alvia.  According the patient her symptoms started with overall body pain as a child and has persisted since then.  She states in April 2024 she started having increased pain in her neck and shoulders.  At the time she was evaluated by Dr. Alvia.  At the time the labs showed positive ANA and normal sedimentation rate.  Dr. Alvia decided to place her on hydroxychloroquine  and Celebrex .  She was also started on Cymbalta  in June 2024.  She states over time she noticed improvement in her fatigue and the rashes which she was experiencing on her forearms for the last 2 years.  She states her pain level on 0-10 is anywhere from 3-8.  She states in September 2024 while she was working she started experiencing photosensitivity.  At the time she was seen by an ophthalmologist Bonni.  She states she was told that she had inflammation in her eyes and was given prednisone  eyedrops.  She used it for 7 days and then stopped.  She states she had another flare in December for which she also use the prednisone  eyedrops.  She has not had a flare since then.  She has not seen the ophthalmologist since then.  She states she went to see Dr. Missie a rheumatologist in [place] in May 2024 who did not diagnose her with systemic lupus.  He advised her to continue on the current regimen.  As she gives history of shortness of breath, chest pain and palpitations.  She had a cardiology workup which was negative per patient.  She denies history of Raynaud's.  She gives history of hair loss, rashes, photosensitivity and swollen glands.  She is gravida  2, para 2.  There is no history of preeclampsia or DVTs.  She works as a museum/gallery conservator.  She enjoys reading and fishing.  This family history of rheumatoid arthritis in her mother.    Activities of Daily Living:  Patient reports morning stiffness for 45 minutes.   Patient Reports nocturnal pain.  Difficulty dressing/grooming: Reports Difficulty climbing stairs: Reports Difficulty getting out of chair: Reports Difficulty using hands for taps, buttons, cutlery, and/or writing: Reports  Review of Systems  Constitutional:  Positive for fatigue.  HENT:  Positive for mouth sores. Negative for mouth dryness.        Fever blisters  Eyes:  Negative for dryness.  Respiratory:  Positive for shortness of breath.   Cardiovascular:  Positive for chest pain and palpitations.       Cardiology w/u negative per patient.  Gastrointestinal:  Positive for constipation and diarrhea. Negative for blood in stool.  Endocrine: Positive for increased urination.  Genitourinary:  Positive for involuntary urination.  Musculoskeletal:  Positive for joint pain, gait problem, joint pain, joint swelling, myalgias, morning stiffness, muscle tenderness and myalgias. Negative for muscle weakness.  Skin:  Positive for rash, hair loss and sensitivity to sunlight. Negative for color change.  Allergic/Immunologic: Positive for susceptible to infections.  Neurological:  Positive for  dizziness and headaches.  Hematological:  Positive for swollen glands.  Psychiatric/Behavioral:  Positive for sleep disturbance. Negative for depressed mood. The patient is nervous/anxious.     PMFS History:  Patient Active Problem List   Diagnosis Date Noted   Abnormal uterine bleeding (AUB) 10/28/2022   Adjustment disorder 09/02/2022   SLE (systemic lupus erythematosus related syndrome) (HCC) 07/29/2022   Elevated antinuclear antibody (ANA) level 07/17/2022   Acute pain of both shoulders 06/29/2022   Chest pain 04/08/2022    Chronic joint pain 07/10/2021   Other fatigue 07/10/2021   Colon cancer screening 07/10/2021   Nicotine dependence 07/10/2021   LUMBAR RADICULOPATHY 11/13/2008   BREAST MASS, LEFT 02/03/2008    Past Medical History:  Diagnosis Date   Anxiety 2000   I do not know an exact date   Arthritis    Bronchitis    Colon cancer screening 07/10/2021   GERD (gastroesophageal reflux disease)    Pneumonia    Seasonal allergies     Family History  Problem Relation Age of Onset   Cancer Mother    Hypertension Mother    Arthritis Mother    COPD Mother    Early death Mother    Hypertension Father    Cancer Maternal Grandmother    Cancer Maternal Grandfather    Hypertension Maternal Grandfather    Cancer Paternal Grandmother    Hypertension Paternal Grandmother    Heart disease Paternal Grandmother    Hearing loss Paternal Grandmother    Cancer Paternal Grandfather    Coronary artery disease Other    Cancer Other    Arthritis Other    ADD / ADHD Daughter    Past Surgical History:  Procedure Laterality Date   TEMPOROMANDIBULAR JOINT SURGERY     TMJ surgeries     TONSILLECTOMY     TUBAL LIGATION     Social History   Social History Narrative   Not on file    There is no immunization history on file for this patient.   Objective: Vital Signs: BP 130/81 (BP Location: Right Arm, Patient Position: Sitting, Cuff Size: Large)   Pulse 74   Resp 14   Ht 5' 4 (1.626 m)   Wt 207 lb (93.9 kg)   LMP 12/31/2022   BMI 35.53 kg/m    Physical Exam Vitals and nursing note reviewed.  Constitutional:      Appearance: She is well-developed.  HENT:     Head: Normocephalic and atraumatic.  Eyes:     Conjunctiva/sclera: Conjunctivae normal.  Cardiovascular:     Rate and Rhythm: Normal rate and regular rhythm.     Heart sounds: Normal heart sounds.  Pulmonary:     Effort: Pulmonary effort is normal.     Breath sounds: Normal breath sounds.  Abdominal:     General: Bowel sounds are  normal.     Palpations: Abdomen is soft.  Musculoskeletal:     Cervical back: Normal range of motion.  Lymphadenopathy:     Cervical: No cervical adenopathy.  Skin:    General: Skin is warm and dry.     Capillary Refill: Capillary refill takes less than 2 seconds.  Neurological:     Mental Status: She is alert and oriented to person, place, and time.  Psychiatric:        Behavior: Behavior normal.      Musculoskeletal Exam: Cervical,, thoracic and lumbar spine were in good range of motion.  There was no SI joint tenderness.  She  had painful range of motion of bilateral shoulder joints.  Elbow joints, wrist joints, MCPs, PIPs and DIPs with good range of motion with no synovitis.  Hip joints and knee joints were in good range of motion.  No warmth swelling or effusion was noted.  There was no tenderness or synovitis over ankles or MTPs.  She had to the bilateral past cavus.  CDAI Exam: CDAI Score: -- Patient Global: --; Provider Global: -- Swollen: --; Tender: -- Joint Exam 04/23/2023   No joint exam has been documented for this visit   There is currently no information documented on the homunculus. Go to the Rheumatology activity and complete the homunculus joint exam.  Investigation: No additional findings.  Imaging: No results found.  Recent Labs: Lab Results  Component Value Date   WBC 7.5 04/29/2022   HGB 12.4 04/29/2022   PLT 355 04/29/2022   NA 141 04/29/2022   K 4.8 04/29/2022   CL 105 04/29/2022   CO2 26 04/29/2022   GLUCOSE 93 04/29/2022   BUN 17 04/29/2022   CREATININE 0.88 04/29/2022   BILITOT 0.7 04/29/2022   ALKPHOS 33 (L) 12/06/2018   AST 15 04/29/2022   ALT 19 04/29/2022   PROT 7.4 04/29/2022   ALBUMIN 3.7 12/06/2018   CALCIUM 10.0 04/29/2022   GFRAA >60 12/06/2018   June 29, 2022 RF negative, anti-CCP negative, ANA 1: 320 NH, 1: 40 cytoplasmic, ESR 2, CRP<3.0, CK21, TSH normal, iron studies normal July 10, 2021 uric acid 5.0  Speciality  Comments: No specialty comments available.  Procedures:  No procedures performed Allergies: Patient has no known allergies.   Assessment / Plan:     Visit Diagnoses: Positive ANA (antinuclear antibody) -patient had positive ANA in the past.  She also gives history of chronic generalized discomfort in multiple joints.  She gives history of intermittent swelling in her right CMC joint and left knee joint.  She states she was experiencing rash intermittently.  She was a started on hydroxychloroquine  by Dr. Alvia in April 2024 after positive ANA.  Patient reports improvement in fatigue and rash since she has been on hydroxychloroquine .  She was evaluated by Dr. Missie in May 2024 who did not agree with the diagnosis of systemic lupus.  Patient states she has been having inflammation in her eye and her ophthalmologist believes that she has an autoimmune disease.  I do not have those records available.  Patient gives history of of fatigue, joint pain and swelling, rashes, photosensitivity, hair loss, lymphadenopathy.  No synovitis was noted on the examination today.  No lymphadenopathy was noted.  She had good capillary refill with no nailbed capillary changes or sclerodactyly.  Plan: Protein / creatinine ratio, urine, Sedimentation rate, ANA, Anti-scleroderma antibody, RNP Antibody, Anti-Smith antibody, Sjogrens syndrome-A extractable nuclear antibody, Sjogrens syndrome-B extractable nuclear antibody, Anti-DNA antibody, double-stranded, C3 and C4, Beta-2  glycoprotein antibodies, Cardiolipin antibodies, IgG, IgM, IgA  High risk medication use -she has been on hydroxychloroquine  since April 2024.  Patient states she has been getting eye examinations with the ophthalmologist.  Plan: CBC with Differential/Platelet, COMPLETE METABOLIC PANEL WITH GFR, Glucose 6 phosphate dehydrogenase  Chronic pain of both shoulders-she complains of pain and discomfort in her bilateral shoulders for many years.  X-rays of her  right shoulder joint were reviewed from April 2024 which were unremarkable.  Pain in both hands -she complains of pain and discomfort in the bilateral hands and discomfort and swelling in her right CMC joint.  No synovitis was noted  on the examination today.  Plan: XR Hand 2 View Right, XR Hand 2 View Left.  X-rays of bilateral hands were suggestive of osteoarthritis.  Chronic pain of both knees -she complain of pain and discomfort in the bilateral knee joints and intermittent swelling in her left knee joint.  No warmth swelling or effusion was noted.  Plan: XR KNEE 3 VIEW LEFT, XR KNEE 3 VIEW RIGHT.  X-rays of bilateral knee joints were unremarkable.  Pain in both feet -she has bilateral pes cavus.  No synovitis was noted on the examination.  She has ongoing pain and discomfort.  Plan: XR Foot 2 Views Right, XR Foot 2 Views Left.  X-rays were unremarkable except for bilateral inferior and posterior calcaneal spurs.  Rash-patient states she was having recurrent rash on her forearms for the last 2 years.  She states the rash resolved since she has been on Plaquenil .  Pain of right eye-she developed pain in her right eye and photosensitivity in September 2024.  At the time she was evaluated by Dr. Jeffie and ophthalmologist in [place].  Patient states she was told that her eye inflammation was due to autoimmune disease.  She was given prednisone  eyedrops.  She had similar episode in December which she used prednisone  eyedrops herself and did not go to see the ophthalmologist.  I advised her to have the records forwarded to us  to review.  Polyarthralgia-she complains of pain and discomfort in her joints and muscles since she was a child.  She has always had muscle cramps.  Chronic midline low back pain without sciatica-she can history of chronic lower back pain.  She had MRI of her lumbar spine in 2010 which was unremarkable.  She is also had x-rays since then which were unremarkable.  Chronic  pain syndrome -she is on Cymbalta , tizanidine  and tramadol  as needed per Dr. Alvia.  Other fatigue-she gives history of chronic fatigue.  Other medical problems are listed as follows:  Anxiety and depression  Smoker - 1PPDx 25 years. Vaping now.  Abnormal uterine bleeding (AUB) - improved after progesterone  Family history of rheumatoid arthritis-mother - According the patient she was treated with imipramine, gabapentin, narcotics and prednisone .  Orders: Orders Placed This Encounter  Procedures   XR Hand 2 View Right   XR Hand 2 View Left   XR KNEE 3 VIEW LEFT   XR Foot 2 Views Right   XR Foot 2 Views Left   XR KNEE 3 VIEW RIGHT   CBC with Differential/Platelet   COMPLETE METABOLIC PANEL WITH GFR   Protein / creatinine ratio, urine   Sedimentation rate   ANA   Anti-scleroderma antibody   RNP Antibody   Anti-Smith antibody   Sjogrens syndrome-A extractable nuclear antibody   Sjogrens syndrome-B extractable nuclear antibody   Anti-DNA antibody, double-stranded   C3 and C4   Beta-2  glycoprotein antibodies   Cardiolipin antibodies, IgG, IgM, IgA   Glucose 6 phosphate dehydrogenase   No orders of the defined types were placed in this encounter.   Face-to-face time spent with patient was 60 minutes. Greater than 50% of time was spent in counseling and coordination of care.  Follow-Up Instructions: Return for Positive ANA, fatigue, arthralgias.   Maya Nash, MD  Note - This record has been created using Animal nutritionist.  Chart creation errors have been sought, but may not always  have been located. Such creation errors do not reflect on  the standard of medical care.

## 2023-04-17 ENCOUNTER — Other Ambulatory Visit: Payer: Self-pay | Admitting: Family Medicine

## 2023-04-21 ENCOUNTER — Encounter: Payer: Self-pay | Admitting: Family Medicine

## 2023-04-23 ENCOUNTER — Ambulatory Visit: Payer: No Typology Code available for payment source

## 2023-04-23 ENCOUNTER — Ambulatory Visit (INDEPENDENT_AMBULATORY_CARE_PROVIDER_SITE_OTHER): Payer: No Typology Code available for payment source

## 2023-04-23 ENCOUNTER — Ambulatory Visit: Payer: No Typology Code available for payment source | Attending: Rheumatology | Admitting: Rheumatology

## 2023-04-23 ENCOUNTER — Encounter: Payer: Self-pay | Admitting: Rheumatology

## 2023-04-23 VITALS — BP 130/81 | HR 74 | Resp 14 | Ht 64.0 in | Wt 207.0 lb

## 2023-04-23 DIAGNOSIS — F17200 Nicotine dependence, unspecified, uncomplicated: Secondary | ICD-10-CM

## 2023-04-23 DIAGNOSIS — M79641 Pain in right hand: Secondary | ICD-10-CM

## 2023-04-23 DIAGNOSIS — F32A Depression, unspecified: Secondary | ICD-10-CM

## 2023-04-23 DIAGNOSIS — G894 Chronic pain syndrome: Secondary | ICD-10-CM

## 2023-04-23 DIAGNOSIS — Z8261 Family history of arthritis: Secondary | ICD-10-CM

## 2023-04-23 DIAGNOSIS — M79671 Pain in right foot: Secondary | ICD-10-CM

## 2023-04-23 DIAGNOSIS — Z79899 Other long term (current) drug therapy: Secondary | ICD-10-CM | POA: Diagnosis not present

## 2023-04-23 DIAGNOSIS — R21 Rash and other nonspecific skin eruption: Secondary | ICD-10-CM

## 2023-04-23 DIAGNOSIS — M25512 Pain in left shoulder: Secondary | ICD-10-CM

## 2023-04-23 DIAGNOSIS — M79672 Pain in left foot: Secondary | ICD-10-CM | POA: Diagnosis not present

## 2023-04-23 DIAGNOSIS — M25561 Pain in right knee: Secondary | ICD-10-CM | POA: Diagnosis not present

## 2023-04-23 DIAGNOSIS — R768 Other specified abnormal immunological findings in serum: Secondary | ICD-10-CM | POA: Diagnosis not present

## 2023-04-23 DIAGNOSIS — M25511 Pain in right shoulder: Secondary | ICD-10-CM | POA: Diagnosis not present

## 2023-04-23 DIAGNOSIS — M25562 Pain in left knee: Secondary | ICD-10-CM | POA: Diagnosis not present

## 2023-04-23 DIAGNOSIS — G8929 Other chronic pain: Secondary | ICD-10-CM | POA: Diagnosis not present

## 2023-04-23 DIAGNOSIS — M545 Low back pain, unspecified: Secondary | ICD-10-CM

## 2023-04-23 DIAGNOSIS — N939 Abnormal uterine and vaginal bleeding, unspecified: Secondary | ICD-10-CM

## 2023-04-23 DIAGNOSIS — M2550 Pain in unspecified joint: Secondary | ICD-10-CM

## 2023-04-23 DIAGNOSIS — R5383 Other fatigue: Secondary | ICD-10-CM

## 2023-04-23 DIAGNOSIS — M79642 Pain in left hand: Secondary | ICD-10-CM

## 2023-04-23 DIAGNOSIS — F419 Anxiety disorder, unspecified: Secondary | ICD-10-CM

## 2023-04-23 DIAGNOSIS — H5711 Ocular pain, right eye: Secondary | ICD-10-CM

## 2023-04-27 LAB — CBC WITH DIFFERENTIAL/PLATELET
Absolute Lymphocytes: 2421 {cells}/uL (ref 850–3900)
Absolute Monocytes: 320 {cells}/uL (ref 200–950)
Basophils Absolute: 48 {cells}/uL (ref 0–200)
Basophils Relative: 0.7 %
Eosinophils Absolute: 190 {cells}/uL (ref 15–500)
Eosinophils Relative: 2.8 %
HCT: 38.8 % (ref 35.0–45.0)
Hemoglobin: 12.9 g/dL (ref 11.7–15.5)
MCH: 32.1 pg (ref 27.0–33.0)
MCHC: 33.2 g/dL (ref 32.0–36.0)
MCV: 96.5 fL (ref 80.0–100.0)
MPV: 9 fL (ref 7.5–12.5)
Monocytes Relative: 4.7 %
Neutro Abs: 3822 {cells}/uL (ref 1500–7800)
Neutrophils Relative %: 56.2 %
Platelets: 335 10*3/uL (ref 140–400)
RBC: 4.02 10*6/uL (ref 3.80–5.10)
RDW: 11.3 % (ref 11.0–15.0)
Total Lymphocyte: 35.6 %
WBC: 6.8 10*3/uL (ref 3.8–10.8)

## 2023-04-27 LAB — PROTEIN / CREATININE RATIO, URINE
Creatinine, Urine: 32 mg/dL (ref 20–275)
Protein/Creat Ratio: 125 mg/g{creat} (ref 24–184)
Protein/Creatinine Ratio: 0.125 mg/mg{creat} (ref 0.024–0.184)
Total Protein, Urine: 4 mg/dL — ABNORMAL LOW (ref 5–24)

## 2023-04-27 LAB — ANTI-NUCLEAR AB-TITER (ANA TITER)
ANA TITER: 1:320 {titer} — ABNORMAL HIGH
ANA Titer 1: 1:320 {titer} — ABNORMAL HIGH

## 2023-04-27 LAB — ANTI-DNA ANTIBODY, DOUBLE-STRANDED: ds DNA Ab: <1 [IU]/mL

## 2023-04-27 LAB — ANTI-SCLERODERMA ANTIBODY: Scleroderma (Scl-70) (ENA) Antibody, IgG: <1 AI

## 2023-04-27 LAB — COMPLETE METABOLIC PANEL WITH GFR
AG Ratio: 1.5 (calc) (ref 1.0–2.5)
ALT: 24 U/L (ref 6–29)
AST: 18 U/L (ref 10–35)
Albumin: 4.5 g/dL (ref 3.6–5.1)
Alkaline phosphatase (APISO): 35 U/L (ref 31–125)
BUN: 14 mg/dL (ref 7–25)
CO2: 28 mmol/L (ref 20–32)
Calcium: 10.2 mg/dL (ref 8.6–10.2)
Chloride: 104 mmol/L (ref 98–110)
Creat: 0.95 mg/dL (ref 0.50–0.99)
Globulin: 3 g/dL (ref 1.9–3.7)
Glucose, Bld: 96 mg/dL (ref 65–99)
Potassium: 5 mmol/L (ref 3.5–5.3)
Sodium: 139 mmol/L (ref 135–146)
Total Bilirubin: 0.6 mg/dL (ref 0.2–1.2)
Total Protein: 7.5 g/dL (ref 6.1–8.1)
eGFR: 74 mL/min/{1.73_m2} (ref >=60)

## 2023-04-27 LAB — GLUCOSE 6 PHOSPHATE DEHYDROGENASE: G-6PDH: 14.6 U/g{Hb} (ref 7.0–20.5)

## 2023-04-27 LAB — BETA-2 GLYCOPROTEIN ANTIBODIES
Beta-2 Glyco 1 IgA: <2 U/mL (ref <20.0)
Beta-2 Glyco 1 IgM: <2 U/mL (ref <20.0)
Beta-2 Glyco I IgG: <2 U/mL (ref <20.0)

## 2023-04-27 LAB — ANTI-SMITH ANTIBODY: ENA SM Ab Ser-aCnc: <1 AI

## 2023-04-27 LAB — ANA: Anti Nuclear Antibody (ANA): POSITIVE — AB

## 2023-04-27 LAB — CARDIOLIPIN ANTIBODIES, IGG, IGM, IGA
Anticardiolipin IgA: <2 [APL'U]/mL (ref <20.0)
Anticardiolipin IgG: <2 [GPL'U]/mL (ref <20.0)
Anticardiolipin IgM: <2 [MPL'U]/mL (ref <20.0)

## 2023-04-27 LAB — C3 AND C4
C3 Complement: 146 mg/dL (ref 83–193)
C4 Complement: 16 mg/dL (ref 15–57)

## 2023-04-27 LAB — SJOGRENS SYNDROME-A EXTRACTABLE NUCLEAR ANTIBODY: SSA (Ro) (ENA) Antibody, IgG: <1 AI

## 2023-04-27 LAB — RNP ANTIBODY: Ribonucleic Protein(ENA) Antibody, IgG: 6.3 AI — AB

## 2023-04-27 LAB — SJOGRENS SYNDROME-B EXTRACTABLE NUCLEAR ANTIBODY: SSB (La) (ENA) Antibody, IgG: <1 AI

## 2023-04-27 LAB — SEDIMENTATION RATE: Sed Rate: 11 mm/h (ref 0–20)

## 2023-05-01 ENCOUNTER — Emergency Department (HOSPITAL_BASED_OUTPATIENT_CLINIC_OR_DEPARTMENT_OTHER): Payer: No Typology Code available for payment source

## 2023-05-01 ENCOUNTER — Emergency Department (HOSPITAL_BASED_OUTPATIENT_CLINIC_OR_DEPARTMENT_OTHER)
Admission: EM | Admit: 2023-05-01 | Discharge: 2023-05-01 | Disposition: A | Discharge status: Home / Self Care | Payer: No Typology Code available for payment source | Attending: Emergency Medicine | Admitting: Emergency Medicine

## 2023-05-01 ENCOUNTER — Encounter (HOSPITAL_BASED_OUTPATIENT_CLINIC_OR_DEPARTMENT_OTHER): Payer: Self-pay

## 2023-05-01 ENCOUNTER — Other Ambulatory Visit: Payer: Self-pay

## 2023-05-01 DIAGNOSIS — R0789 Other chest pain: Secondary | ICD-10-CM | POA: Insufficient documentation

## 2023-05-01 DIAGNOSIS — R079 Chest pain, unspecified: Secondary | ICD-10-CM

## 2023-05-01 HISTORY — DX: Systemic lupus erythematosus, unspecified: M32.9

## 2023-05-01 LAB — BASIC METABOLIC PANEL
Anion gap: 9 (ref 5–15)
BUN: 16 mg/dL (ref 6–20)
CO2: 26 mmol/L (ref 22–32)
Calcium: 9.4 mg/dL (ref 8.9–10.3)
Chloride: 104 mmol/L (ref 98–111)
Creatinine, Ser: 0.87 mg/dL (ref 0.44–1.00)
GFR, Estimated: >60 mL/min (ref >60)
Glucose, Bld: 107 mg/dL — ABNORMAL HIGH (ref 70–99)
Potassium: 4.5 mmol/L (ref 3.5–5.1)
Sodium: 139 mmol/L (ref 135–145)

## 2023-05-01 LAB — CBC
HCT: 34.7 % — ABNORMAL LOW (ref 36.0–46.0)
Hemoglobin: 12.1 g/dL (ref 12.0–15.0)
MCH: 32.8 pg (ref 26.0–34.0)
MCHC: 34.9 g/dL (ref 30.0–36.0)
MCV: 94 fL (ref 80.0–100.0)
Platelets: 271 10*3/uL (ref 150–400)
RBC: 3.69 MIL/uL — ABNORMAL LOW (ref 3.87–5.11)
RDW: 11.6 % (ref 11.5–15.5)
WBC: 6.9 10*3/uL (ref 4.0–10.5)
nRBC: 0 % (ref 0.0–0.2)

## 2023-05-01 LAB — TROPONIN I (HIGH SENSITIVITY): Troponin I (High Sensitivity): <2 ng/L (ref <18)

## 2023-05-01 LAB — PREGNANCY, URINE: Preg Test, Ur: NEGATIVE

## 2023-05-01 LAB — D-DIMER, QUANTITATIVE: D-Dimer, Quant: <0.27 ug{FEU}/mL (ref 0.00–0.50)

## 2023-05-01 NOTE — ED Triage Notes (Signed)
 Pt reports that she has chest pain that radiates to right side of her neck. Pt also reports chest pain. States that the she has been having some heart palpitations x 3 days off and on. Pt also states some SOB.

## 2023-05-01 NOTE — ED Notes (Signed)
 Pt is a hard stick. Was able to get bloodwork but not a line. Will wait for EDP to see pt.

## 2023-05-01 NOTE — ED Provider Notes (Signed)
  EMERGENCY DEPARTMENT AT [facility] Provider Note   CSN: [id number] Arrival date & time: 05/01/23  0944     History  Chief Complaint  Patient presents with   Chest Pain    Michele Herrera is a 48 y.o. female.  Pt is a 48 yo female with of lupus, anxiety, and GERD presenting for chest pressure. Pt admits to sternal chest pressure with radiation to the right neck that started last night at rest. Denies diaphoresis. Admits to palpitations without chest pain over the past 7 days.  Denies fevers, chills, coughing.  Denies lower extremity swelling or orthopnea.  Does admit to some right sided calf cramping over the last 2 weeks.  Patient does have a history of GERD but states this feels unlike any of her acid reflux symptoms she has had in the past.  The history is provided by the patient. No language interpreter was used.  Chest Pain Associated symptoms: no abdominal pain, no back pain, no cough, no fever, no palpitations, no shortness of breath and no vomiting        Home Medications Prior to Admission medications   Medication Sig Start Date End Date Taking? Authorizing Provider  acetaminophen (TYLENOL) 650 MG CR tablet Take 650 mg by mouth every 8 (eight) hours as needed for pain.    [provider]  celecoxib (CELEBREX) 100 MG capsule Take 100 mg by mouth daily. 07/23/22   [provider]  DULoxetine (CYMBALTA) 30 MG capsule TAKE 1 CAPSULE BY MOUTH EVERY DAY 04/21/23   Everrett Coombe, DO  hydroxychloroquine (PLAQUENIL) 200 MG tablet TAKE 1 TABLET BY MOUTH EVERY DAY 04/21/23   Everrett Coombe, DO  norethindrone (AYGESTIN) 5 MG tablet Take 5 mg by mouth daily. 11/03/22   [provider]  ondansetron (ZOFRAN-ODT) 4 MG disintegrating tablet Take 1 tablet (4 mg total) by mouth every 8 (eight) hours as needed for nausea or vomiting. 12/30/22   Everrett Coombe, DO  prednisoLONE acetate (PRED FORTE) 1 % ophthalmic suspension 1 drop 4 (four) times  daily. For Flares    [provider]  predniSONE (STERAPRED UNI-PAK 48 TAB) 10 MG (48) TBPK tablet Taper as directed on packaging. 12/30/22   Everrett Coombe, DO  tiZANidine (ZANAFLEX) 4 MG tablet TAKE 1 TABLET BY MOUTH EVERY 6 HOURS AS NEEDED FOR MUSCLE SPASMS. 03/15/23   Everrett Coombe, DO  traMADol (ULTRAM) 50 MG tablet TAKE 1 TABLET BY MOUTH EVERY 8 HOURS AS NEEDED. 01/12/23   Everrett Coombe, DO      Allergies    Patient has no known allergies.    Review of Systems   Review of Systems  Constitutional:  Negative for chills and fever.  HENT:  Negative for ear pain and sore throat.   Eyes:  Negative for pain and visual disturbance.  Respiratory:  Negative for cough and shortness of breath.   Cardiovascular:  Positive for chest pain. Negative for palpitations.  Gastrointestinal:  Negative for abdominal pain and vomiting.  Genitourinary:  Negative for dysuria and hematuria.  Musculoskeletal:  Negative for arthralgias and back pain.  Skin:  Negative for color change and rash.  Neurological:  Negative for seizures and syncope.  All other systems reviewed and are negative.   Physical Exam Updated Vital Signs BP (!) 149/80   Pulse 65   Temp 97.7 F (36.5 C) (Oral)   Ht 5\' 4"  (1.626 m)   Wt 93.9 kg   LMP 12/31/2022   SpO2 100%  BMI 35.53 kg/m  Physical Exam Vitals and nursing note reviewed.  Constitutional:      General: She is not in acute distress.    Appearance: She is well-developed.  HENT:     Head: Normocephalic and atraumatic.  Eyes:     Conjunctiva/sclera: Conjunctivae normal.  Cardiovascular:     Rate and Rhythm: Normal rate and regular rhythm.     Heart sounds: No murmur heard. Pulmonary:     Effort: Pulmonary effort is normal. No respiratory distress.     Breath sounds: Normal breath sounds.  Abdominal:     Palpations: Abdomen is soft.     Tenderness: There is no abdominal tenderness.  Musculoskeletal:        General: No swelling.     Cervical  back: Neck supple.  Skin:    General: Skin is warm and dry.     Capillary Refill: Capillary refill takes less than 2 seconds.  Neurological:     Mental Status: She is alert.  Psychiatric:        Mood and Affect: Mood normal.     ED Results / Procedures / Treatments   Labs (all labs ordered are listed, but only abnormal results are displayed) Labs Reviewed  BASIC METABOLIC PANEL - Abnormal; Notable for the following components:      Result Value   Glucose, Bld 107 (*)    All other components within normal limits  CBC - Abnormal; Notable for the following components:   RBC 3.69 (*)    HCT 34.7 (*)    All other components within normal limits  PREGNANCY, URINE  D-DIMER, QUANTITATIVE  TROPONIN I (HIGH SENSITIVITY)    EKG EKG Interpretation Date/Time:  Saturday May 01 2023 09:57:32 EST Ventricular Rate:  65 PR Interval:  149 QRS Duration:  88 QT Interval:  421 QTC Calculation: 438 R Axis:   27  Text Interpretation: Sinus rhythm Low voltage, precordial leads Abnormal R-wave progression, early transition Confirmed by Edwin Dada ([id number]) on 05/01/2023 11:45:06 AM  Radiology DG Chest 2 View Result Date: 05/01/2023 CLINICAL DATA:  48 year old female with history of chest pressure. EXAM: CHEST - 2 VIEW COMPARISON:  Chest x-ray 04/07/2022. FINDINGS: Lung volumes are normal. No consolidative airspace disease. No pleural effusions. No pneumothorax. No pulmonary nodule or mass noted. Pulmonary vasculature and the cardiomediastinal silhouette are within normal limits. IMPRESSION: No radiographic evidence of acute cardiopulmonary disease. Electronically Signed   By: Trudie Reed M.D.   On: 05/01/2023 10:17    Procedures Procedures    Medications Ordered in ED Medications - No data to display  ED Course/ Medical Decision Making/ A&P                                 Medical Decision Making Amount and/or Complexity of Data Reviewed Labs: ordered. Radiology: ordered.     47 yo female with of lupus, anxiety, and GERD presenting for chest pressure.  [place] diagnosis includes but is not limited to ACS, GERD, pulmonary etiology including PE, pneumothorax, pneumonia, etc.    The patient's chest pain is not suggestive of pulmonary embolus, cardiac ischemia, aortic dissection, pericarditis, myocarditis, pulmonary embolism, pneumothorax, pneumonia, Zoster, or esophageal perforation, or other serious etiology.  Historically not abrupt in onset, tearing or ripping, pulses symmetric. EKG nonspecific for ischemia/infarction. No dysrhythmias, brugada, WPW, prolonged QT noted. CXR reviewed and WNL. Troponin negative x1.  Second troponin unnecessary at this time due  to patient's chest pain symptoms starting greater than 3 hours ago-last night.  Low risk heart score.  Patient in no distress and overall condition improved here in the ED. Detailed discussions were had with the patient regarding current findings, and need for close f/u with PCP or on call doctor. The patient has been instructed to return immediately if the symptoms worsen in any way for re-evaluation. Patient verbalized understanding and is in agreement with current care plan. All questions answered prior to discharge.         Final Clinical Impression(s) / ED Diagnoses Final diagnoses:  Chest pain, unspecified type    Rx / DC Orders ED Discharge Orders     None         Franne Forts, DO 05/01/23 1404

## 2023-05-14 NOTE — Progress Notes (Signed)
 Office Visit Note  Patient: Michele Herrera             Date of Birth: 08/20/1975           MRN: [id number]             PCP: Michele Coombe, DO Referring: Michele Coombe, DO Visit Date: 05/28/2023 Occupation: @GUAROCC @  Subjective:  Pain in multiple joints  History of Present Illness: Michele Herrera is a 48 y.o. female with Active tissue disease and polyarthralgia.  She states she continues to have pain and discomfort in almost all of her joints.  She describes discomfort in her both shoulders, both hands, knees and her feet.  She has not had recurrence of rash since it has been on hydroxychloroquine.  She gives history of fatigue, dry mouth, dry eyes, sores in her mouth, shortness of breath, raynaud's phenominon involving her feet, hair loss, photosensitivity.  She continues to have some lower back pain and chronic discomfort despite being on medications.    Activities of Daily Living:  Patient reports morning stiffness for 45-60 minutes.   Patient Reports nocturnal pain.  Difficulty dressing/grooming: Reports Difficulty climbing stairs: Reports Difficulty getting out of chair: Reports Difficulty using hands for taps, buttons, cutlery, and/or writing: Reports  Review of Systems  Constitutional:  Positive for fatigue.  HENT:  Positive for mouth sores and mouth dryness.   Eyes:  Positive for photophobia, pain and redness. Negative for dryness.  Respiratory:  Positive for shortness of breath. Negative for cough and wheezing.   Cardiovascular:  Positive for palpitations. Negative for chest pain.  Gastrointestinal:  Positive for constipation and diarrhea. Negative for blood in stool.  Endocrine: Negative for increased urination.  Genitourinary:  Positive for involuntary urination.  Musculoskeletal:  Positive for joint pain, gait problem, joint pain, joint swelling, myalgias, muscle weakness, morning stiffness, muscle tenderness and myalgias.  Skin:  Positive for color change,  rash, hair loss and sensitivity to sunlight.  Allergic/Immunologic: Positive for susceptible to infections.  Neurological:  Positive for dizziness and headaches.  Hematological:  Positive for swollen glands.  Psychiatric/Behavioral:  Positive for sleep disturbance. Negative for depressed mood. The patient is nervous/anxious.     PMFS History:  Patient Active Problem List   Diagnosis Date Noted   Abnormal uterine bleeding (AUB) 10/28/2022   Adjustment disorder 09/02/2022   SLE (systemic lupus erythematosus related syndrome) (HCC) 07/29/2022   Elevated antinuclear antibody (ANA) level 07/17/2022   Acute pain of both shoulders 06/29/2022   Chest pain 04/08/2022   Chronic joint pain 07/10/2021   Other fatigue 07/10/2021   Colon cancer screening 07/10/2021   Nicotine dependence 07/10/2021   LUMBAR RADICULOPATHY 11/13/2008   BREAST MASS, LEFT 02/03/2008    Past Medical History:  Diagnosis Date   Anxiety 2000   I do not know an exact date   Arthritis    Bronchitis    Colon cancer screening 07/10/2021   GERD (gastroesophageal reflux disease)    Lupus (systemic lupus erythematosus) (HCC)    Pneumonia    Seasonal allergies     Family History  Problem Relation Age of Onset   Cancer Mother    Hypertension Mother    Arthritis Mother    COPD Mother    Early death Mother    Hypertension Father    Cancer Maternal Grandmother    Cancer Maternal Grandfather    Hypertension Maternal Grandfather    Cancer Paternal Grandmother    Hypertension Paternal Grandmother  Heart disease Paternal Grandmother    Hearing loss Paternal Grandmother    Cancer Paternal Grandfather    Coronary artery disease Other    Cancer Other    Arthritis Other    ADD / ADHD Daughter    Past Surgical History:  Procedure Laterality Date   TEMPOROMANDIBULAR JOINT SURGERY     TMJ surgeries     TONSILLECTOMY     TUBAL LIGATION     Social History   Social History Narrative   Not on file    There is no  immunization history on file for this patient.   Objective: Vital Signs: BP 127/83 (BP Location: Left Arm, Patient Position: Sitting, Cuff Size: Large)   Pulse 68   Resp 17   Ht 5\' 4"  (1.626 m)   Wt 210 lb 6.4 oz (95.4 kg)   BMI 36.12 kg/m    Physical Exam Vitals and nursing note reviewed.  Constitutional:      Appearance: She is well-developed.  HENT:     Head: Normocephalic and atraumatic.  Eyes:     Conjunctiva/sclera: Conjunctivae normal.  Cardiovascular:     Rate and Rhythm: Normal rate and regular rhythm.     Heart sounds: Normal heart sounds.  Pulmonary:     Effort: Pulmonary effort is normal.     Breath sounds: Normal breath sounds.  Abdominal:     General: Bowel sounds are normal.     Palpations: Abdomen is soft.  Musculoskeletal:     Cervical back: Normal range of motion.  Lymphadenopathy:     Cervical: No cervical adenopathy.  Skin:    General: Skin is warm and dry.     Capillary Refill: Capillary refill takes less than 2 seconds.     Comments: She had good capillary refill without a sclerodactyly or telangiectasia.  Livedo reticularis was noted.  Neurological:     Mental Status: She is alert and oriented to person, place, and time.  Psychiatric:        Behavior: Behavior normal.      Musculoskeletal Exam: Cervical, thoracic and lumbar spine 1 good range of motion.  She had no point tenderness.  Shoulder joints were in good range of motion with some discomfort.  Elbow joints and wrist joints in good range of motion.  There was no synovitis over MCPs PIPs and DIPs.  Hip joints and knee joints with good range of motion without any warmth swelling or effusion.  There was no tenderness over ankles or MTPs.  Bilateral pes cavus was noted.  CDAI Exam: CDAI Score: -- Patient Global: --; Provider Global: -- Swollen: --; Tender: -- Joint Exam 05/28/2023   No joint exam has been documented for this visit   There is currently no information documented on the  homunculus. Go to the Rheumatology activity and complete the homunculus joint exam.  Investigation: No additional findings.  Imaging: DG Chest 2 View Result Date: 05/01/2023 CLINICAL DATA:  48 year old female with history of chest pressure. EXAM: CHEST - 2 VIEW COMPARISON:  Chest x-ray 04/07/2022. FINDINGS: Lung volumes are normal. No consolidative airspace disease. No pleural effusions. No pneumothorax. No pulmonary nodule or mass noted. Pulmonary vasculature and the cardiomediastinal silhouette are within normal limits. IMPRESSION: No radiographic evidence of acute cardiopulmonary disease. Electronically Signed   By: Trudie Reed M.D.   On: 05/01/2023 10:17    Recent Labs: Lab Results  Component Value Date   WBC 6.9 05/01/2023   HGB 12.1 05/01/2023   PLT 271 05/01/2023  NA 139 05/01/2023   K 4.5 05/01/2023   CL 104 05/01/2023   CO2 26 05/01/2023   GLUCOSE 107 (H) 05/01/2023   BUN 16 05/01/2023   CREATININE 0.87 05/01/2023   BILITOT 0.6 04/23/2023   ALKPHOS 33 (L) 12/06/2018   AST 18 04/23/2023   ALT 24 04/23/2023   PROT 7.5 04/23/2023   ALBUMIN 3.7 12/06/2018   CALCIUM 9.4 05/01/2023   GFRAA >60 12/06/2018    April 22, 2022 urine protein creatinine ratio normal, ANA 1: 320 NH, 1: 320 nuclear dot RNP 6.3, SCL 70 negative, Smith negative, SSA negative, SSB negative, dsDNA negative, beta-2 GP 1 negative, anticardiolipin negative, C3-C4 normal, ESR 11  Speciality Comments: PLQ Eye Exam: 12/10/2022 (visual field), 09/02/2022 (OCT) both WNL, 08/12/2022 (comprehensive eye exam) @ [facility] [place] Follow up in 1 year  Procedures:  No procedures performed Allergies: Patient has no known allergies.   Assessment / Plan:     Visit Diagnoses: MCTD (mixed connective tissue disease) (HCC) - Positive ANA, positive RNP, fatigue, dry mouth, arthralgias, rashes, photosensitivity, hair loss, Raynaud's, livedo reticularis, lymphadenopathy per patient.  Detailed counsel regarding  mixed connective tissue disease was provided.  She has been on hydroxychloroquine low dose since April 2024.  Patient states that she has noticed improvement in her symptoms especially rash since she has been on hydroxychloroquine.  She continues to have arthralgias.  No synovitis was noted on the examination.  I plan to keep her on hydroxychloroquine and increase the dose.  Indications side effects and contraindications of hydroxychloroquine were discussed at length.  A handout was given and consent was taken.  Patient was counseled on the purpose, proper use, and adverse effects of hydroxychloroquine including nausea/diarrhea, skin rash, headaches, and sun sensitivity.  Advised patient to wear sunscreen once starting hydroxychloroquine to reduce risk of rash associated with sun sensitivity.  Discussed importance of annual eye exams while on hydroxychloroquine to monitor to ocular toxicity and discussed importance of frequent laboratory monitoring.  Provided patient with eye exam form for baseline ophthalmologic exam.  Provided patient with educational materials on hydroxychloroquine and answered all questions.  Patient consented to hydroxychloroquine. Will upload consent in the media tab.    Reviewed risk for QTC prolongation when used in combination with other QTc prolonging agents (including but not limited to antiarrhythmics, macrolide antibiotics, flouroquinolone antibiotics, haloperidol, quetiapine, olanzapine, risperidone, droperidol, ziprasidone, amitriptyline, citalopram, ondansetron, migraine triptans, and methadone).   High risk medication use - Hydroxychloroquine since April 2024 by Dr. Ashley Royalty.  Will increase the dose of hydroxychloroquine to 200 mg p.o. twice daily Monday to Friday.  She had an eye examination by ophthalmologist in September 2024.  Annual eye examination to screen for ocular toxicity was advised.  Information for immunization was placed in the AVS.  Chronic pain of both  shoulders -she is some stiffness in her shoulders.  X-rays April 2024 were unremarkable.  Primary osteoarthritis of both hands -she has some stiffness in her hands.  No synovitis was noted.  Clinical and radiographic findings were suggestive of osteoarthritis.  Chronic pain of both knees - History of intermittent swelling.  Notes warmth swelling or effusion was noted.  X-rays of bilateral knee joints were unremarkable.  Pain in both feet - Bilateral pes cavus noted.  X-rays were unremarkable except for inferior and posterior calcaneal spurs.  X-ray findings were reviewed with the patient.  Rash - History of recurrent rash per patient which resolved on Plaquenil.  No rash was noted on the  examination today.  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  I do not have records.  The note I have from ophthalmologist states that she had eye examination which was normal including OCT and field of vision  Chronic midline low back pain without sciatica - MRI 2000 time was unremarkable.  X-rays were unremarkable.  Shortness of breath-patient has been experiencing increased shortness of breath.  The chest x-ray obtained recently was unremarkable.  I will refer her to pulmonologist for PFTs and evaluation.  She is also a chronic smoker.  Chronic pain syndrome -she continues to have generalized pain and discomfort.  She is on Cymbalta, tizanidine and tramadol prescribed by Dr. Ashley Royalty.  Other fatigue-she is ongoing fatigue.  Anxiety and depression  Smoker - 1 pack/day for 25 years.  She is vaping now.  Abnormal uterine bleeding (AUB) - Improved on progesterone per patient.  Family history of rheumatoid arthritis-mother - According to the patient her mother is on narcotics and prednisone.  Orders: Orders Placed This Encounter  Procedures   Ambulatory referral to Pulmonology   Meds ordered this encounter   Medications   hydroxychloroquine (PLAQUENIL) 200 MG tablet    Sig: Take 200mg  by mouth twice daily, Monday through Friday only. None on Saturday or Sunday.    Dispense:  120 tablet    Refill:  0     Follow-Up Instructions: Return in about 3 months (around 08/28/2023) for MCTD.   Pollyann Savoy, MD  Note - This record has been created using Animal nutritionist.  Chart creation errors have been sought, but may not always  have been located. Such creation errors do not reflect on  the standard of medical care.

## 2023-05-26 LAB — HM MAMMOGRAPHY

## 2023-05-27 ENCOUNTER — Other Ambulatory Visit: Payer: Self-pay | Admitting: Family Medicine

## 2023-05-27 DIAGNOSIS — G8929 Other chronic pain: Secondary | ICD-10-CM

## 2023-05-28 ENCOUNTER — Ambulatory Visit: Payer: 59 | Attending: Rheumatology | Admitting: Rheumatology

## 2023-05-28 ENCOUNTER — Encounter: Payer: Self-pay | Admitting: Rheumatology

## 2023-05-28 VITALS — BP 127/83 | HR 68 | Resp 17 | Ht 64.0 in | Wt 210.4 lb

## 2023-05-28 DIAGNOSIS — R21 Rash and other nonspecific skin eruption: Secondary | ICD-10-CM

## 2023-05-28 DIAGNOSIS — M25561 Pain in right knee: Secondary | ICD-10-CM

## 2023-05-28 DIAGNOSIS — R5383 Other fatigue: Secondary | ICD-10-CM

## 2023-05-28 DIAGNOSIS — Z79899 Other long term (current) drug therapy: Secondary | ICD-10-CM

## 2023-05-28 DIAGNOSIS — F17200 Nicotine dependence, unspecified, uncomplicated: Secondary | ICD-10-CM

## 2023-05-28 DIAGNOSIS — H5711 Ocular pain, right eye: Secondary | ICD-10-CM

## 2023-05-28 DIAGNOSIS — M79672 Pain in left foot: Secondary | ICD-10-CM

## 2023-05-28 DIAGNOSIS — M25511 Pain in right shoulder: Secondary | ICD-10-CM | POA: Diagnosis not present

## 2023-05-28 DIAGNOSIS — F32A Depression, unspecified: Secondary | ICD-10-CM

## 2023-05-28 DIAGNOSIS — G8929 Other chronic pain: Secondary | ICD-10-CM

## 2023-05-28 DIAGNOSIS — F419 Anxiety disorder, unspecified: Secondary | ICD-10-CM

## 2023-05-28 DIAGNOSIS — N939 Abnormal uterine and vaginal bleeding, unspecified: Secondary | ICD-10-CM

## 2023-05-28 DIAGNOSIS — M25512 Pain in left shoulder: Secondary | ICD-10-CM

## 2023-05-28 DIAGNOSIS — Z8261 Family history of arthritis: Secondary | ICD-10-CM

## 2023-05-28 DIAGNOSIS — R0602 Shortness of breath: Secondary | ICD-10-CM

## 2023-05-28 DIAGNOSIS — M79671 Pain in right foot: Secondary | ICD-10-CM

## 2023-05-28 DIAGNOSIS — M25562 Pain in left knee: Secondary | ICD-10-CM

## 2023-05-28 DIAGNOSIS — G894 Chronic pain syndrome: Secondary | ICD-10-CM

## 2023-05-28 DIAGNOSIS — M351 Other overlap syndromes: Secondary | ICD-10-CM | POA: Diagnosis not present

## 2023-05-28 DIAGNOSIS — R768 Other specified abnormal immunological findings in serum: Secondary | ICD-10-CM

## 2023-05-28 DIAGNOSIS — M545 Low back pain, unspecified: Secondary | ICD-10-CM

## 2023-05-28 DIAGNOSIS — M19041 Primary osteoarthritis, right hand: Secondary | ICD-10-CM | POA: Diagnosis not present

## 2023-05-28 DIAGNOSIS — M19042 Primary osteoarthritis, left hand: Secondary | ICD-10-CM

## 2023-05-28 MED ORDER — HYDROXYCHLOROQUINE SULFATE 200 MG PO TABS
ORAL_TABLET | ORAL | 0 refills | Status: DC
Start: 2023-05-28 — End: 2023-08-18

## 2023-05-28 NOTE — Patient Instructions (Addendum)
 Hydroxychloroquine Tablets What is this medication? HYDROXYCHLOROQUINE (hye drox ee KLOR oh kwin) treats autoimmune conditions, such as rheumatoid arthritis and lupus. It works by slowing down an overactive immune system. It may also be used to prevent and treat malaria. It works by killing the parasite that causes malaria. It belongs to a group of medications called DMARDs. This medicine may be used for other purposes; ask your health care provider or pharmacist if you have questions. COMMON BRAND NAME(S): Plaquenil, Quineprox, SOVUNA What should I tell my care team before I take this medication? They need to know if you have any of these conditions: Diabetes Eye disease, vision problems Frequently drink alcohol G6PD deficiency Heart disease Irregular heartbeat or rhythm Kidney disease Liver disease Porphyria Psoriasis An unusual or allergic reaction to hydroxychloroquine, other medications, foods, dyes, or preservatives Pregnant or trying to get pregnant Breastfeeding How should I use this medication? Take this medication by mouth with water. Take it as directed on the prescription label. Do not cut, crush, or chew this medication. Swallow the tablets whole. Take it with food. Do not take it more than directed. Take all of this medication unless your care team tells you to stop it early. Keep taking it even if you think you are better. Take products with antacids in them at a different time of day than this medication. Take this medication 4 hours before or 4 hours after antacids. Talk to your care team if you have questions. Talk to your care team about the use of this medication in children. While this medication may be prescribed for selected conditions, precautions do apply. Overdosage: If you think you have taken too much of this medicine contact a poison control center or emergency room at once. NOTE: This medicine is only for you. Do not share this medicine with others. What if I  miss a dose? If you miss a dose, take it as soon as you can. If it is almost time for your next dose, take only that dose. Do not take double or extra doses. What may interact with this medication? Do not take this medication with any of the following: Cisapride Dronedarone Pimozide Thioridazine This medication may also interact with the following: Ampicillin Antacids Cimetidine Cyclosporine Digoxin Kaolin Medications for diabetes, such as insulin, glipizide, glyburide Medications for seizures, such as carbamazepine, phenobarbital, phenytoin Mefloquine Methotrexate Other medications that cause heart rhythm changes Praziquantel This list may not describe all possible interactions. Give your health care provider a list of all the medicines, herbs, non-prescription drugs, or dietary supplements you use. Also tell them if you smoke, drink alcohol, or use illegal drugs. Some items may interact with your medicine. What should I watch for while using this medication? Visit your care team for regular checks on your progress. Tell your care team if your symptoms do not start to get better or if they get worse. You may need blood work done while you are taking this medication. If you take other medications that can affect heart rhythm, you may need more testing. Talk to your care team if you have questions. Your vision may be tested before and during use of this medication. Tell your care team right away if you have any change in your eyesight. This medication may cause serious skin reactions. They can happen weeks to months after starting the medication. Contact your care team right away if you notice fevers or flu-like symptoms with a rash. The rash may be red or purple and then  turn into blisters or peeling of the skin. Or, you might notice a red rash with swelling of the face, lips or lymph nodes in your neck or under your arms. If you or your family notice any changes in your behavior, such as  new or worsening depression, thoughts of harming yourself, anxiety, or other unusual or disturbing thoughts, or memory loss, call your care team right away. What side effects may I notice from receiving this medication? Side effects that you should report to your care team as soon as possible: Allergic reactions--skin rash, itching, hives, swelling of the face, lips, tongue, or throat Aplastic anemia--unusual weakness or fatigue, dizziness, headache, trouble breathing, increased bleeding or bruising Change in vision Heart rhythm changes--fast or irregular heartbeat, dizziness, feeling faint or lightheaded, chest pain, trouble breathing Infection--fever, chills, cough, or sore throat Low blood sugar (hypoglycemia)--tremors or shaking, anxiety, sweating, cold or clammy skin, confusion, dizziness, rapid heartbeat Muscle injury--unusual weakness or fatigue, muscle pain, dark yellow or brown urine, decrease in amount of urine Pain, tingling, or numbness in the hands or feet Rash, fever, and swollen lymph nodes Redness, blistering, peeling, or loosening of the skin, including inside the mouth Thoughts of suicide or self-harm, worsening mood, or feelings of depression Unusual bruising or bleeding Side effects that usually do not require medical attention (report to your care team if they continue or are bothersome): Diarrhea Headache Nausea Stomach pain Vomiting This list may not describe all possible side effects. Call your doctor for medical advice about side effects. You may report side effects to FDA at 1-800-FDA-1088. Where should I keep my medication? Keep out of the reach of children and pets. Store at room temperature up to 30 degrees C (86 degrees F). Protect from light. Get rid of any unused medication after the expiration date. To get rid of medications that are no longer needed or have expired: Take the medication to a medication take-back program. Check with your pharmacy or law  enforcement to find a location. If you cannot return the medication, check the label or package insert to see if the medication should be thrown out in the garbage or flushed down the toilet. If you are not sure, ask your care team. If it is safe to put it in the trash, empty the medication out of the container. Mix the medication with cat litter, dirt, coffee grounds, or other unwanted substance. Seal the mixture in a bag or container. Put it in the trash. NOTE: This sheet is a summary. It may not cover all possible information. If you have questions about this medicine, talk to your doctor, pharmacist, or health care provider.  2024 Elsevier/Gold Standard (2021-09-08 00:00:00)  Standing Labs We placed an order today for your standing lab work.   Please have your standing labs drawn in 5 months  Please have your labs drawn 2 weeks prior to your appointment so that the provider can discuss your lab results at your appointment, if possible.  Please note that you may see your imaging and lab results in MyChart before we have reviewed them. We will contact you once all results are reviewed. Please allow our office up to 72 hours to thoroughly review all of the results before contacting the office for clarification of your results.  WALK-IN LAB HOURS  Monday through Thursday from 8:00 am -12:30 pm and 1:00 pm-5:00 pm and Friday from 8:00 am-12:00 pm.  Patients with office visits requiring labs will be seen before walk-in labs.  You  may encounter longer than normal wait times. Please allow additional time. Wait times may be shorter on  Monday and Thursday afternoons.  We do not book appointments for walk-in labs. We appreciate your patience and understanding with our staff.   Labs are drawn by [facility]. Please bring your co-pay at the time of your lab draw.  You may receive a bill from [facility] for your lab work.  Please note if you are on Hydroxychloroquine and and an order has been placed for a  Hydroxychloroquine level,  you will need to have it drawn 4 hours or more after your last dose.  If you wish to have your labs drawn at another location, please call the office 24 hours in advance so we can fax the orders.  The office is located at [address], [address], [place], [place] [address]   If you have any questions regarding directions or hours of operation,  please call [phone].   As a reminder, please drink plenty of water prior to coming for your lab work. Thanks!   Vaccines You are taking a medication(s) that can suppress your immune system.  The following immunizations are recommended: Flu annually Covid-19  RSV Td/Tdap (tetanus, diphtheria, pertussis) every 10 years Pneumonia (Prevnar 15 then Pneumovax 23 at least 1 year apart.  Alternatively, can take Prevnar 20 without needing additional dose) Shingrix: 2 doses from 4 weeks to 6 months apart  Please check with your PCP to make sure you are up to date.

## 2023-06-16 ENCOUNTER — Ambulatory Visit: Admitting: Family Medicine

## 2023-07-29 ENCOUNTER — Other Ambulatory Visit: Payer: Self-pay | Admitting: Medical-Surgical

## 2023-07-29 DIAGNOSIS — G8929 Other chronic pain: Secondary | ICD-10-CM

## 2023-08-18 ENCOUNTER — Other Ambulatory Visit: Payer: Self-pay | Admitting: Rheumatology

## 2023-08-18 DIAGNOSIS — M351 Other overlap syndromes: Secondary | ICD-10-CM

## 2023-08-18 NOTE — Progress Notes (Deleted)
 Office Visit Note  Patient: Michele Herrera             Date of Birth: August 26, 1975           MRN: [id number]             PCP: Adela Holter, DO Referring: Adela Holter, DO Visit Date: 09/01/2023 Occupation: @GUAROCC @  Subjective:    History of Present Illness: Michele Herrera is a 48 y.o. female with history of mixed connective tissue disease and osteoarthritis.  She is taking Plaquenil  200 mg 1 tablet by mouth twice daily Monday through Friday.  Lab work from 04/23/2023 was reviewed today in the office: ANA 1: 320 NH, 1: 320 nuclear, few nuclear dots, ESR 11, SCL 70 negative, RNP 6.3, Smith antibody negative, Ro antibody negative, La antibody negative, double-stranded a negative, complements within normal limits, beta-2  glycoprotein antibodies negative, anticardiolipin antibodies negative.  CBC and BMP drawn on 05/01/2023. PLQ Eye Exam: 12/10/2022 (visual field), 09/02/2022 (OCT) both WNL, 08/12/2022 (comprehensive eye exam) @ [facility] Follow up in 1 year   Activities of Daily Living:  Patient reports morning stiffness for *** {minute/hour:19697}.   Patient {ACTIONS;DENIES/REPORTS:21021675::Denies} nocturnal pain.  Difficulty dressing/grooming: {ACTIONS;DENIES/REPORTS:21021675::Denies} Difficulty climbing stairs: {ACTIONS;DENIES/REPORTS:21021675::Denies} Difficulty getting out of chair: {ACTIONS;DENIES/REPORTS:21021675::Denies} Difficulty using hands for taps, buttons, cutlery, and/or writing: {ACTIONS;DENIES/REPORTS:21021675::Denies}  No Rheumatology ROS completed.   PMFS History:  Patient Active Problem List   Diagnosis Date Noted   Abnormal uterine bleeding (AUB) 10/28/2022   Adjustment disorder 09/02/2022   SLE (systemic lupus erythematosus related syndrome) (HCC) 07/29/2022   Elevated antinuclear antibody (ANA) level 07/17/2022   Acute pain of both shoulders 06/29/2022   Chest pain 04/08/2022   Chronic joint pain 07/10/2021   Other fatigue  07/10/2021   Colon cancer screening 07/10/2021   Nicotine dependence 07/10/2021   LUMBAR RADICULOPATHY 11/13/2008   BREAST MASS, LEFT 02/03/2008    Past Medical History:  Diagnosis Date   Anxiety 2000   I do not know an exact date   Arthritis    Bronchitis    Colon cancer screening 07/10/2021   GERD (gastroesophageal reflux disease)    Lupus (systemic lupus erythematosus) (HCC)    Pneumonia    Seasonal allergies     Family History  Problem Relation Age of Onset   Cancer Mother    Hypertension Mother    Arthritis Mother    COPD Mother    Early death Mother    Hypertension Father    Cancer Maternal Grandmother    Cancer Maternal Grandfather    Hypertension Maternal Grandfather    Cancer Paternal Grandmother    Hypertension Paternal Grandmother    Heart disease Paternal Grandmother    Hearing loss Paternal Grandmother    Cancer Paternal Grandfather    Coronary artery disease Other    Cancer Other    Arthritis Other    ADD / ADHD Daughter    Past Surgical History:  Procedure Laterality Date   TEMPOROMANDIBULAR JOINT SURGERY     TMJ surgeries     TONSILLECTOMY     TUBAL LIGATION     Social History   Social History Narrative   Not on file    There is no immunization history on file for this patient.   Objective: Vital Signs: There were no vitals taken for this visit.   Physical Exam Vitals and nursing note reviewed.  Constitutional:      Appearance: She is well-developed.  HENT:  Head: Normocephalic and atraumatic.   Eyes:     Conjunctiva/sclera: Conjunctivae normal.    Cardiovascular:     Rate and Rhythm: Normal rate and regular rhythm.     Heart sounds: Normal heart sounds.  Pulmonary:     Effort: Pulmonary effort is normal.     Breath sounds: Normal breath sounds.  Abdominal:     General: Bowel sounds are normal.     Palpations: Abdomen is soft.   Musculoskeletal:     Cervical back: Normal range of motion.  Lymphadenopathy:      Cervical: No cervical adenopathy.   Skin:    General: Skin is warm and dry.     Capillary Refill: Capillary refill takes less than 2 seconds.   Neurological:     Mental Status: She is alert and oriented to person, place, and time.   Psychiatric:        Behavior: Behavior normal.      Musculoskeletal Exam: ***  CDAI Exam: CDAI Score: -- Patient Global: --; Provider Global: -- Swollen: --; Tender: -- Joint Exam 09/01/2023   No joint exam has been documented for this visit   There is currently no information documented on the homunculus. Go to the Rheumatology activity and complete the homunculus joint exam.  Investigation: No additional findings.  Imaging: No results found.  Recent Labs: Lab Results  Component Value Date   WBC 6.9 05/01/2023   HGB 12.1 05/01/2023   PLT 271 05/01/2023   NA 139 05/01/2023   K 4.5 05/01/2023   CL 104 05/01/2023   CO2 26 05/01/2023   GLUCOSE 107 (H) 05/01/2023   BUN 16 05/01/2023   CREATININE 0.87 05/01/2023   BILITOT 0.6 04/23/2023   ALKPHOS 33 (L) 12/06/2018   AST 18 04/23/2023   ALT 24 04/23/2023   PROT 7.5 04/23/2023   ALBUMIN 3.7 12/06/2018   CALCIUM 9.4 05/01/2023   GFRAA >60 12/06/2018    Speciality Comments: PLQ Eye Exam: 12/10/2022 (visual field), 09/02/2022 (OCT) both WNL, 08/12/2022 (comprehensive eye exam) @ [facility] Follow up in 1 year  Procedures:  No procedures performed Allergies: Patient has no known allergies.   Assessment / Plan:     Visit Diagnoses: MCTD (mixed connective tissue disease) (HCC)  High risk medication use  Chronic pain of both shoulders  Primary osteoarthritis of both hands  Chronic pain of both knees  Pain in both feet  Rash  Pain of right eye  Chronic midline low back pain without sciatica  Chronic pain syndrome  Anxiety and depression  Smoker  Abnormal uterine bleeding (AUB)  Family history of rheumatoid arthritis-mother  Other  fatigue  Orders: No orders of the defined types were placed in this encounter.  No orders of the defined types were placed in this encounter.   Face-to-face time spent with patient was *** minutes. Greater than 50% of time was spent in counseling and coordination of care.  Follow-Up Instructions: No follow-ups on file.   Romayne Clubs, PA-C  Note - This record has been created using Dragon software.  Chart creation errors have been sought, but may not always  have been located. Such creation errors do not reflect on  the standard of medical care.

## 2023-08-18 NOTE — Telephone Encounter (Signed)
 Last Fill: 05/28/2023  Eye exam: 9/26/2024WNL     Labs: 04/23/2023  CMP and CBC WNL    Next Visit: 09/01/2023  Last Visit: 05/28/2023  DX: MCTD   Current Dose per office note 05/29/2023: 200 mg p.o. twice daily Monday to Friday.   Okay to refill Plaquenil ?

## 2023-08-21 ENCOUNTER — Other Ambulatory Visit: Payer: Self-pay | Admitting: Family Medicine

## 2023-08-21 DIAGNOSIS — G8929 Other chronic pain: Secondary | ICD-10-CM

## 2023-08-23 ENCOUNTER — Telehealth: Admitting: Family Medicine

## 2023-08-23 DIAGNOSIS — B9689 Other specified bacterial agents as the cause of diseases classified elsewhere: Secondary | ICD-10-CM | POA: Diagnosis not present

## 2023-08-23 DIAGNOSIS — J019 Acute sinusitis, unspecified: Secondary | ICD-10-CM

## 2023-08-23 MED ORDER — AMOXICILLIN-POT CLAVULANATE 875-125 MG PO TABS
1.0000 | ORAL_TABLET | Freq: Two times a day (BID) | ORAL | 0 refills | Status: AC
Start: 2023-08-23 — End: 2023-08-30

## 2023-08-23 MED ORDER — PROMETHAZINE-DM 6.25-15 MG/5ML PO SYRP: 5.0000 mL | ORAL_SOLUTION | Freq: Four times a day (QID) | ORAL | 0 refills | Status: DC | PRN

## 2023-08-23 MED ORDER — BENZONATATE 100 MG PO CAPS: 100.0000 mg | ORAL_CAPSULE | Freq: Three times a day (TID) | ORAL | 0 refills | Status: DC | PRN

## 2023-08-23 NOTE — Telephone Encounter (Signed)
 Pls contact the pt to schedule appt with Dr. Augustus Ledger for medication refills. Last seen 12/30/22. Thx

## 2023-08-23 NOTE — Telephone Encounter (Signed)
 Patient scheduled for 09/01/23, thanks.

## 2023-08-23 NOTE — Progress Notes (Signed)
 Virtual Visit Consent   Michele Herrera, you are scheduled for a virtual visit with a [facility] provider today. Just as with appointments in the office, your consent must be obtained to participate. Your consent will be active for this visit and any virtual visit you may have with one of our providers in the next 365 days. If you have a MyChart account, a copy of this consent can be sent to you electronically.  As this is a virtual visit, video technology does not allow for your provider to perform a traditional examination. This may limit your provider's ability to fully assess your condition. If your provider identifies any concerns that need to be evaluated in person or the need to arrange testing (such as labs, EKG, etc.), we will make arrangements to do so. Although advances in technology are sophisticated, we cannot ensure that it will always work on either your end or our end. If the connection with a video visit is poor, the visit may have to be switched to a telephone visit. With either a video or telephone visit, we are not always able to ensure that we have a secure connection.  By engaging in this virtual visit, you consent to the provision of healthcare and authorize for your insurance to be billed (if applicable) for the services provided during this visit. Depending on your insurance coverage, you may receive a charge related to this service.  I need to obtain your verbal consent now. Are you willing to proceed with your visit today? LEKEYA ROLLINGS has provided verbal consent on 08/23/2023 for a virtual visit (video or telephone). Lanetta Pion, NP  Date: 08/23/2023 8:48 AM   Virtual Visit via Video Note   I, Lanetta Pion, connected with  JANAKI EXLEY  ([id number], 08/06/1975) on 08/23/23 at  8:45 AM EDT by a video-enabled telemedicine application and verified that I am speaking with the correct person using two identifiers.  Location: Patient: Virtual Visit Location  Patient: Home Provider: Virtual Visit Location Provider: Home Office   I discussed the limitations of evaluation and management by telemedicine and the availability of in person appointments. The patient expressed understanding and agreed to proceed.    History of Present Illness: Michele Herrera is a 48 y.o. who identifies as a female who was assigned female at birth, and is being seen today for sinus infection.  Onset was 5 days ago with congestion and sneezing, 4 days ago worsen, and 24 hours ago started with coughing, burning in chest from coughing (a rawness in back of throat), coughing up slight yellow to green mucus. Temp was highest was 100.2 yesterday. Body aches- fatigued.  Modifying factors are day and ny quil.  Denies chest pain, shortness of breath.  History of mixed connective tissue- on plaquenil .   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  COVID test: is going to get a combo flu one to take   Problems:  Patient Active Problem List   Diagnosis Date Noted   Abnormal uterine bleeding (AUB) 10/28/2022   Adjustment disorder 09/02/2022   SLE (systemic lupus erythematosus related syndrome) (HCC) 07/29/2022   Elevated antinuclear antibody (ANA) level 07/17/2022   Acute pain of both shoulders 06/29/2022   Chest pain 04/08/2022   Chronic joint pain 07/10/2021   Other fatigue 07/10/2021   Colon cancer screening 07/10/2021   Nicotine dependence 07/10/2021   LUMBAR RADICULOPATHY 11/13/2008   BREAST MASS, LEFT 02/03/2008    Allergies:  No Known Allergies Medications:  Current Outpatient Medications:    acetaminophen (TYLENOL) 650 MG CR tablet, Take 650 mg by mouth 4 (four) times daily., Disp: , Rfl:    celecoxib  (CELEBREX ) 100 MG capsule, Take 100 mg by mouth daily., Disp: , Rfl:    DULoxetine  (CYMBALTA ) 30 MG capsule, TAKE 1 CAPSULE BY MOUTH EVERY DAY, Disp: 30 capsule, Rfl: 3   hydroxychloroquine  (PLAQUENIL ) 200 MG tablet, TAKE 200MG  BY MOUTH TWICE DAILY,  MONDAY THROUGH FRIDAY ONLY. NONE ON SATURDAY OR SUNDAY., Disp: 120 tablet, Rfl: 0   ondansetron  (ZOFRAN -ODT) 4 MG disintegrating tablet, Take 1 tablet (4 mg total) by mouth every 8 (eight) hours as needed for nausea or vomiting., Disp: 20 tablet, Rfl: 0   prednisoLONE acetate (PRED FORTE) 1 % ophthalmic suspension, 1 drop as needed. For Flares, Disp: , Rfl:    tiZANidine  (ZANAFLEX ) 4 MG tablet, TAKE 1 TABLET BY MOUTH EVERY 6 HOURS AS NEEDED FOR MUSCLE SPASMS., Disp: 30 tablet, Rfl: 0   traMADol  (ULTRAM ) 50 MG tablet, TAKE 1 TABLET BY MOUTH EVERY 8 HOURS AS NEEDED., Disp: 60 tablet, Rfl: 1  Observations/Objective: Patient is well-developed, well-nourished in no acute distress.  Resting comfortably  at home.  Head is normocephalic, atraumatic.  No labored breathing.  Speech is clear and coherent with logical content.  Patient is alert and oriented at baseline.  Cough is present  Assessment and Plan:  1. Acute bacterial sinusitis (Primary)  - amoxicillin-clavulanate (AUGMENTIN) 875-125 MG tablet; Take 1 tablet by mouth 2 (two) times daily for 7 days.  Dispense: 14 tablet; Refill: 0 - benzonatate  (TESSALON ) 100 MG capsule; Take 1 capsule (100 mg total) by mouth 3 (three) times daily as needed for cough.  Dispense: 30 capsule; Refill: 0 - promethazine-dextromethorphan (PROMETHAZINE-DM) 6.25-15 MG/5ML syrup; Take 5 mLs by mouth 4 (four) times daily as needed for cough.  Dispense: 118 mL; Refill: 0  - Increased rest - Increasing Fluids - Acetaminophen / ibuprofen  as needed for fever/pain.  - Salt water gargling, chloraseptic spray and throat lozenges - Mucinex  if mucus is present and increasing.  - Saline nasal spray if congestion or if nasal passages feel dry. - Humidifying the air.    Reviewed side effects, risks and benefits of medication.    Patient acknowledged agreement and understanding of the plan.   Past Medical, Surgical, Social History, Allergies, and Medications have been  Reviewed.   Follow Up Instructions: I discussed the assessment and treatment plan with the patient. The patient was provided an opportunity to ask questions and all were answered. The patient agreed with the plan and demonstrated an understanding of the instructions.  A copy of instructions were sent to the patient via MyChart unless otherwise noted below.     The patient was advised to call back or seek an in-person evaluation if the symptoms worsen or if the condition fails to improve as anticipated.    Lanetta Pion, NP

## 2023-08-23 NOTE — Patient Instructions (Addendum)
 Michele Herrera, thank you for joining Michele Pion, NP for today's virtual visit.  While this provider is not your primary care provider (PCP), if your PCP is located in our provider database this encounter information will be shared with them immediately following your visit.   A [facility] MyChart account gives you access to today's visit and all your visits, tests, and labs performed at [facility] " click here if you don't have a [facility] MyChart account or go to [email or web address]  Consent: (Patient) Michele Herrera provided verbal consent for this virtual visit at the beginning of the encounter.  Current Medications:  Current Outpatient Medications:    amoxicillin-clavulanate (AUGMENTIN) 875-125 MG tablet, Take 1 tablet by mouth 2 (two) times daily for 7 days., Disp: 14 tablet, Rfl: 0   benzonatate  (TESSALON ) 100 MG capsule, Take 1 capsule (100 mg total) by mouth 3 (three) times daily as needed for cough., Disp: 30 capsule, Rfl: 0   promethazine-dextromethorphan (PROMETHAZINE-DM) 6.25-15 MG/5ML syrup, Take 5 mLs by mouth 4 (four) times daily as needed for cough., Disp: 118 mL, Rfl: 0   acetaminophen (TYLENOL) 650 MG CR tablet, Take 650 mg by mouth 4 (four) times daily., Disp: , Rfl:    celecoxib  (CELEBREX ) 100 MG capsule, Take 100 mg by mouth daily., Disp: , Rfl:    DULoxetine  (CYMBALTA ) 30 MG capsule, TAKE 1 CAPSULE BY MOUTH EVERY DAY, Disp: 30 capsule, Rfl: 3   hydroxychloroquine  (PLAQUENIL ) 200 MG tablet, TAKE 200MG  BY MOUTH TWICE DAILY, MONDAY THROUGH FRIDAY ONLY. NONE ON SATURDAY OR SUNDAY., Disp: 120 tablet, Rfl: 0   ondansetron  (ZOFRAN -ODT) 4 MG disintegrating tablet, Take 1 tablet (4 mg total) by mouth every 8 (eight) hours as needed for nausea or vomiting., Disp: 20 tablet, Rfl: 0   prednisoLONE acetate (PRED FORTE) 1 % ophthalmic suspension, 1 drop as needed. For Flares, Disp: , Rfl:    tiZANidine  (ZANAFLEX ) 4 MG tablet, TAKE 1 TABLET BY MOUTH EVERY  6 HOURS AS NEEDED FOR MUSCLE SPASMS., Disp: 30 tablet, Rfl: 0   traMADol  (ULTRAM ) 50 MG tablet, TAKE 1 TABLET BY MOUTH EVERY 8 HOURS AS NEEDED., Disp: 60 tablet, Rfl: 1   Medications ordered in this encounter:  Meds ordered this encounter  Medications   amoxicillin-clavulanate (AUGMENTIN) 875-125 MG tablet    Sig: Take 1 tablet by mouth 2 (two) times daily for 7 days.    Dispense:  14 tablet    Refill:  0    Supervising Provider:   LAMPTEY, PHILIP O [[id number]]   benzonatate  (TESSALON ) 100 MG capsule    Sig: Take 1 capsule (100 mg total) by mouth 3 (three) times daily as needed for cough.    Dispense:  30 capsule    Refill:  0    Supervising Provider:   LAMPTEY, PHILIP O [[id number]]   promethazine-dextromethorphan (PROMETHAZINE-DM) 6.25-15 MG/5ML syrup    Sig: Take 5 mLs by mouth 4 (four) times daily as needed for cough.    Dispense:  118 mL    Refill:  0    Supervising Provider:   Corine Dice [[id number]]     *If you need refills on other medications prior to your next appointment, please contact your pharmacy*  Follow-Up: Call back or seek an in-person evaluation if the symptoms worsen or if the condition fails to improve as anticipated.  [facility] [phone]  Other Instructions   - Increased rest - Increasing Fluids - Acetaminophen / ibuprofen  as needed  for fever/pain.  - Salt water gargling, chloraseptic spray and throat lozenges - Mucinex  if mucus is present and increasing.  - Saline nasal spray if congestion or if nasal passages feel dry. - Humidifying the air.      If you have been instructed to have an in-person evaluation today at a local Urgent Care facility, please use the link below. It will take you to a list of all of our available [facility], including address, phone number and hours of operation. Please do not delay care.  [facility]  If you or a family member do not have a primary care provider, use the link  below to schedule a visit and establish care. When you choose a [facility] primary care physician or advanced practice provider, you gain a long-term partner in health. Find a Primary Care Provider  Learn more about [facility] in-office and virtual care options: [facility] - Get Care Now

## 2023-09-01 ENCOUNTER — Ambulatory Visit: Admitting: Physician Assistant

## 2023-09-01 ENCOUNTER — Encounter: Admitting: Family Medicine

## 2023-09-01 DIAGNOSIS — R21 Rash and other nonspecific skin eruption: Secondary | ICD-10-CM

## 2023-09-01 DIAGNOSIS — F32A Depression, unspecified: Secondary | ICD-10-CM

## 2023-09-01 DIAGNOSIS — G8929 Other chronic pain: Secondary | ICD-10-CM

## 2023-09-01 DIAGNOSIS — Z79899 Other long term (current) drug therapy: Secondary | ICD-10-CM

## 2023-09-01 DIAGNOSIS — F17200 Nicotine dependence, unspecified, uncomplicated: Secondary | ICD-10-CM

## 2023-09-01 DIAGNOSIS — M351 Other overlap syndromes: Secondary | ICD-10-CM

## 2023-09-01 DIAGNOSIS — M79671 Pain in right foot: Secondary | ICD-10-CM

## 2023-09-01 DIAGNOSIS — M19041 Primary osteoarthritis, right hand: Secondary | ICD-10-CM

## 2023-09-01 DIAGNOSIS — N939 Abnormal uterine and vaginal bleeding, unspecified: Secondary | ICD-10-CM

## 2023-09-01 DIAGNOSIS — H5711 Ocular pain, right eye: Secondary | ICD-10-CM

## 2023-09-01 DIAGNOSIS — G894 Chronic pain syndrome: Secondary | ICD-10-CM

## 2023-09-01 DIAGNOSIS — R5383 Other fatigue: Secondary | ICD-10-CM

## 2023-09-01 DIAGNOSIS — Z8261 Family history of arthritis: Secondary | ICD-10-CM

## 2023-09-08 ENCOUNTER — Encounter: Payer: Self-pay | Admitting: Family Medicine

## 2023-09-08 ENCOUNTER — Ambulatory Visit (INDEPENDENT_AMBULATORY_CARE_PROVIDER_SITE_OTHER): Admitting: Family Medicine

## 2023-09-08 VITALS — BP 111/75 | HR 88 | Ht 64.0 in | Wt 216.0 lb

## 2023-09-08 DIAGNOSIS — Z0000 Encounter for general adult medical examination without abnormal findings: Secondary | ICD-10-CM | POA: Insufficient documentation

## 2023-09-08 DIAGNOSIS — Z23 Encounter for immunization: Secondary | ICD-10-CM

## 2023-09-08 DIAGNOSIS — R635 Abnormal weight gain: Secondary | ICD-10-CM | POA: Diagnosis not present

## 2023-09-08 NOTE — Assessment & Plan Note (Signed)
 Well adult Orders Placed This Encounter  Procedures   Pneumococcal conjugate vaccine 20-valent (Prevnar 20)   Tdap vaccine greater than or equal to 48yo IM   CMP14+EGFR   CBC with Differential/Platelet   Lipid Panel With LDL/HDL Ratio   TSH  Screenings: per lab orders Immunizations:  Prevnar 20 Anticipatory guidance/risk factor reduction: Recommendations per AVS

## 2023-09-08 NOTE — Patient Instructions (Signed)
 Preventive Care 16-48 Years Old, Female  Preventive care refers to lifestyle choices and visits with your health care provider that can promote health and wellness. Preventive care visits are also called wellness exams.  What can I expect for my preventive care visit?  Counseling  Your health care provider may ask you questions about your:  Medical history, including:  Past medical problems.  Family medical history.  Pregnancy history.  Current health, including:  Menstrual cycle.  Method of birth control.  Emotional well-being.  Home life and relationship well-being.  Sexual activity and sexual health.  Lifestyle, including:  Alcohol, nicotine or tobacco, and drug use.  Access to firearms.  Diet, exercise, and sleep habits.  Work and work Astronomer.  Sunscreen use.  Safety issues such as seatbelt and bike helmet use.  Physical exam  Your health care provider will check your:  Height and weight. These may be used to calculate your BMI (body mass index). BMI is a measurement that tells if you are at a healthy weight.  Waist circumference. This measures the distance around your waistline. This measurement also tells if you are at a healthy weight and may help predict your risk of certain diseases, such as type 2 diabetes and high blood pressure.  Heart rate and blood pressure.  Body temperature.  Skin for abnormal spots.  What immunizations do I need?    Vaccines are usually given at various ages, according to a schedule. Your health care provider will recommend vaccines for you based on your age, medical history, and lifestyle or other factors, such as travel or where you work.  What tests do I need?  Screening  Your health care provider may recommend screening tests for certain conditions. This may include:  Lipid and cholesterol levels.  Diabetes screening. This is done by checking your blood sugar (glucose) after you have not eaten for a while (fasting).  Pelvic exam and Pap test.  Hepatitis B test.  Hepatitis C  test.  HIV (human immunodeficiency virus) test.  STI (sexually transmitted infection) testing, if you are at risk.  Lung cancer screening.  Colorectal cancer screening.  Mammogram. Talk with your health care provider about when you should start having regular mammograms. This may depend on whether you have a family history of breast cancer.  BRCA-related cancer screening. This may be done if you have a family history of breast, ovarian, tubal, or peritoneal cancers.  Bone density scan. This is done to screen for osteoporosis.  Talk with your health care provider about your test results, treatment options, and if necessary, the need for more tests.  Follow these instructions at home:  Eating and drinking    Eat a diet that includes fresh fruits and vegetables, whole grains, lean protein, and low-fat dairy products.  Take vitamin and mineral supplements as recommended by your health care provider.  Do not drink alcohol if:  Your health care provider tells you not to drink.  You are pregnant, may be pregnant, or are planning to become pregnant.  If you drink alcohol:  Limit how much you have to 0-1 drink a day.  Know how much alcohol is in your drink. In the [place] one drink equals one 12 oz bottle of beer (355 mL), one 5 oz glass of wine (148 mL), or one 1 oz glass of hard liquor (44 mL).  Lifestyle  Brush your teeth every morning and night with fluoride toothpaste. Floss one time each day.  Exercise for at least  30 minutes 5 or more days each week.  Do not use any products that contain nicotine or tobacco. These products include cigarettes, chewing tobacco, and vaping devices, such as e-cigarettes. If you need help quitting, ask your health care provider.  Do not use drugs.  If you are sexually active, practice safe sex. Use a condom or other form of protection to prevent STIs.  If you do not wish to become pregnant, use a form of birth control. If you plan to become pregnant, see your health care provider for a  prepregnancy visit.  Take aspirin only as told by your health care provider. Make sure that you understand how much to take and what form to take. Work with your health care provider to find out whether it is safe and beneficial for you to take aspirin daily.  Find healthy ways to manage stress, such as:  Meditation, yoga, or listening to music.  Journaling.  Talking to a trusted person.  Spending time with friends and family.  Minimize exposure to UV radiation to reduce your risk of skin cancer.  Safety  Always wear your seat belt while driving or riding in a vehicle.  Do not drive:  If you have been drinking alcohol. Do not ride with someone who has been drinking.  When you are tired or distracted.  While texting.  If you have been using any mind-altering substances or drugs.  Wear a helmet and other protective equipment during sports activities.  If you have firearms in your house, make sure you follow all gun safety procedures.  Seek help if you have been physically or sexually abused.  What's next?  Visit your health care provider once a year for an annual wellness visit.  Ask your health care provider how often you should have your eyes and teeth checked.  Stay up to date on all vaccines.  This information is not intended to replace advice given to you by your health care provider. Make sure you discuss any questions you have with your health care provider.  Document Revised: 08/28/2020 Document Reviewed: 08/28/2020  Elsevier Patient Education  2024 [employer].

## 2023-09-08 NOTE — Progress Notes (Signed)
 Lightly Alexa is going to rain Michele Herrera - 48 y.o. female MRN [id number]  Date of birth: 12-Apr-1975  Subjective Chief Complaint  Patient presents with   Annual Exam    HPI Michele Herrera is a 48 y.o. female here today for annual exam.  She reports she is doing pretty well at this time.  She is seeing rheumatology for mixed connective tissue disease.  Continues on Plaquenil .  She is welcome for activity.  She is concerned about weight gain.  She does not feel like her diet has really changed.  She is a former smoker.  Quit about a year ago.  Rare alcohol.  Review of Systems  Constitutional:  Negative for chills, fever, malaise/fatigue and weight loss.  HENT:  Negative for congestion, ear pain and sore throat.   Eyes:  Negative for blurred vision, double vision and pain.  Respiratory:  Negative for cough and shortness of breath.   Cardiovascular:  Negative for chest pain and palpitations.  Gastrointestinal:  Negative for abdominal pain, blood in stool, constipation, heartburn and nausea.  Genitourinary:  Negative for dysuria and urgency.  Musculoskeletal:  Negative for joint pain and myalgias.  Neurological:  Negative for dizziness and headaches.  Endo/Heme/Allergies:  Does not bruise/bleed easily.  Psychiatric/Behavioral:  Negative for depression. The patient is not nervous/anxious and does not have insomnia.     No Known Allergies  Past Medical History:  Diagnosis Date   Anxiety 2000   I do not know an exact date   Arthritis 2014   This is when i was diagnosed   Bronchitis    Colon cancer screening 07/10/2021   GERD (gastroesophageal reflux disease) 1993   Lupus (systemic lupus erythematosus) (HCC)    Pneumonia    Seasonal allergies     Past Surgical History:  Procedure Laterality Date   TEMPOROMANDIBULAR JOINT SURGERY     TMJ surgeries     TONSILLECTOMY     TUBAL LIGATION  06/1997    Social History   Socioeconomic History   Marital status: Divorced     Spouse name: Not on file   Number of children: 2   Years of education: Not on file   Highest education level: Associate degree: occupational, Scientist, product/process development, or vocational program  Occupational History   Not on file  Tobacco Use   Smoking status: Former    Current packs/day: 0.00    Types: Cigarettes    Quit date: 03/2022    Years since quitting: 1.4    Passive exposure: Past   Smokeless tobacco: Never  Vaping Use   Vaping status: Every Day  Substance and Sexual Activity   Alcohol use: Yes    Comment: ocassional   Drug use: No   Sexual activity: Yes    Birth control/protection: None  Other Topics Concern   Not on file  Social History Narrative   Not on file   Social Drivers of Health   Financial Resource Strain: High Risk (09/08/2023)   Overall Financial Resource Strain (CARDIA)    Difficulty of Paying Living Expenses: Very hard  Food Insecurity: Food Insecurity Present (09/08/2023)   Hunger Vital Sign    Worried About Running Out of Food in the Last Year: Often true    Ran Out of Food in the Last Year: Sometimes true  Transportation Needs: No Transportation Needs (09/08/2023)   PRAPARE - Administrator, Civil Service (Medical): No    Lack of Transportation (Non-Medical): No  Physical Activity:  Insufficiently Active (12/29/2022)   Exercise Vital Sign    Days of Exercise per Week: 3 days    Minutes of Exercise per Session: 30 min  Stress: Stress Concern Present (09/08/2023)   [employer] of Occupational Health - Occupational Stress Questionnaire    Feeling of Stress: To some extent  Social Connections: Socially Isolated (09/08/2023)   Social Connection and Isolation Panel    Frequency of Communication with Friends and Family: Never    Frequency of Social Gatherings with Friends and Family: Never    Attends Religious Services: Never    Database administrator or Organizations: Yes    Attends Banker Meetings: Never    Marital Status: Divorced     Family History  Problem Relation Age of Onset   Cancer Mother    Hypertension Mother    Arthritis Mother    COPD Mother    Early death Mother    Hypertension Father    Cancer Maternal Grandmother    Cancer Maternal Grandfather    Hypertension Maternal Grandfather    Cancer Paternal Grandmother    Hypertension Paternal Grandmother    Heart disease Paternal Grandmother    Hearing loss Paternal Grandmother    Cancer Paternal Grandfather    Coronary artery disease Other    Cancer Other    Arthritis Other    ADD / ADHD Daughter     Health Maintenance  Topic Date Due   COVID-19 Vaccine (1) Never done   HIV Screening  Never done   Hepatitis B Vaccines (1 of 3 - 19+ 3-dose series) Never done   Cervical Cancer Screening (HPV/Pap Cotest)  Never done   MAMMOGRAM  01/14/2023   Hepatitis C Screening  11/03/2023 (Originally 05/19/1993)   INFLUENZA VACCINE  10/15/2023   Colonoscopy  10/09/2031   DTaP/Tdap/Td (2 - Td or Tdap) 09/07/2033   Pneumococcal Vaccine 97-30 Years old  Completed   HPV VACCINES  Aged Out   Meningococcal B Vaccine  Aged Out     ----------------------------------------------------------------------------------------------------------------------------------------------------------------------------------------------------------------- Physical Exam BP 111/75 (BP Location: Right Arm, Patient Position: Sitting, Cuff Size: Large)   Pulse 88   Ht 5' 4 (1.626 m)   Wt 216 lb (98 kg)   SpO2 98%   BMI 37.08 kg/m   Physical Exam Constitutional:      General: She is not in acute distress. HENT:     Head: Normocephalic and atraumatic.     Right Ear: Tympanic membrane and ear canal normal.     Left Ear: Tympanic membrane and ear canal normal.     Nose: Nose normal.   Eyes:     General: No scleral icterus.    Conjunctiva/sclera: Conjunctivae normal.   Neck:     Thyroid : No thyromegaly.   Cardiovascular:     Rate and Rhythm: Normal rate and regular  rhythm.     Heart sounds: Normal heart sounds.  Pulmonary:     Effort: Pulmonary effort is normal.     Breath sounds: Normal breath sounds.  Abdominal:     General: Bowel sounds are normal. There is no distension.     Palpations: Abdomen is soft.     Tenderness: There is no abdominal tenderness. There is no guarding.   Musculoskeletal:        General: Normal range of motion.     Cervical back: Normal range of motion and neck supple.  Lymphadenopathy:     Cervical: No cervical adenopathy.   Skin:  General: Skin is warm and dry.     Findings: No rash.   Neurological:     General: No focal deficit present.     Mental Status: She is alert and oriented to person, place, and time.     Cranial Nerves: No cranial nerve deficit.     Coordination: Coordination normal.   Psychiatric:        Mood and Affect: Mood normal.        Behavior: Behavior normal.     ------------------------------------------------------------------------------------------------------------------------------------------------------------------------------------------------------------------- Assessment and Plan  Well adult exam Well adult Orders Placed This Encounter  Procedures   Pneumococcal conjugate vaccine 20-valent (Prevnar 20)   Tdap vaccine greater than or equal to 7yo IM   CMP14+EGFR   CBC with Differential/Platelet   Lipid Panel With LDL/HDL Ratio   TSH  Screenings: per lab orders Immunizations:  Prevnar 20 Anticipatory guidance/risk factor reduction: Recommendations per AVS   No orders of the defined types were placed in this encounter.   No follow-ups on file.

## 2023-09-09 LAB — CMP14+EGFR
ALT: 28 IU/L (ref 0–32)
AST: 23 IU/L (ref 0–40)
Albumin: 4.4 g/dL (ref 3.9–4.9)
Alkaline Phosphatase: 48 IU/L (ref 44–121)
BUN/Creatinine Ratio: 16 (ref 9–23)
BUN: 13 mg/dL (ref 6–24)
Bilirubin Total: 0.6 mg/dL (ref 0.0–1.2)
CO2: 22 mmol/L (ref 20–29)
Calcium: 9.7 mg/dL (ref 8.7–10.2)
Chloride: 102 mmol/L (ref 96–106)
Creatinine, Ser: 0.81 mg/dL (ref 0.57–1.00)
Globulin, Total: 2.8 g/dL (ref 1.5–4.5)
Glucose: 87 mg/dL (ref 70–99)
Potassium: 4.5 mmol/L (ref 3.5–5.2)
Sodium: 140 mmol/L (ref 134–144)
Total Protein: 7.2 g/dL (ref 6.0–8.5)
eGFR: 89 mL/min/{1.73_m2} (ref >59)

## 2023-09-09 LAB — CBC WITH DIFFERENTIAL/PLATELET
Basophils Absolute: 0.1 10*3/uL (ref 0.0–0.2)
Basos: 1 %
EOS (ABSOLUTE): 0.2 10*3/uL (ref 0.0–0.4)
Eos: 3 %
Hematocrit: 40.4 % (ref 34.0–46.6)
Hemoglobin: 13.5 g/dL (ref 11.1–15.9)
Immature Grans (Abs): 0 10*3/uL (ref 0.0–0.1)
Immature Granulocytes: 0 %
Lymphocytes Absolute: 3.3 10*3/uL — ABNORMAL HIGH (ref 0.7–3.1)
Lymphs: 39 %
MCH: 32.6 pg (ref 26.6–33.0)
MCHC: 33.4 g/dL (ref 31.5–35.7)
MCV: 98 fL — ABNORMAL HIGH (ref 79–97)
Monocytes Absolute: 0.6 10*3/uL (ref 0.1–0.9)
Monocytes: 7 %
Neutrophils Absolute: 4.2 10*3/uL (ref 1.4–7.0)
Neutrophils: 50 %
Platelets: 358 10*3/uL (ref 150–450)
RBC: 4.14 x10E6/uL (ref 3.77–5.28)
RDW: 12 % (ref 11.7–15.4)
WBC: 8.4 10*3/uL (ref 3.4–10.8)

## 2023-09-09 LAB — LIPID PANEL WITH LDL/HDL RATIO
Cholesterol, Total: 206 mg/dL — ABNORMAL HIGH (ref 100–199)
HDL: 47 mg/dL (ref >39)
LDL Chol Calc (NIH): 129 mg/dL — ABNORMAL HIGH (ref 0–99)
LDL/HDL Ratio: 2.7 ratio (ref 0.0–3.2)
Triglycerides: 168 mg/dL — ABNORMAL HIGH (ref 0–149)
VLDL Cholesterol Cal: 30 mg/dL (ref 5–40)

## 2023-09-09 LAB — TSH: TSH: 4.01 u[IU]/mL (ref 0.450–4.500)

## 2023-09-13 ENCOUNTER — Encounter: Payer: Self-pay | Admitting: Family Medicine

## 2023-09-16 ENCOUNTER — Encounter: Payer: Self-pay | Admitting: Family Medicine

## 2023-09-16 DIAGNOSIS — G8929 Other chronic pain: Secondary | ICD-10-CM

## 2023-09-19 ENCOUNTER — Ambulatory Visit: Payer: Self-pay | Admitting: Family Medicine

## 2023-09-23 ENCOUNTER — Ambulatory Visit: Payer: Self-pay

## 2023-09-23 ENCOUNTER — Ambulatory Visit
Admission: EM | Admit: 2023-09-23 | Discharge: 2023-09-23 | Disposition: A | Discharge status: Home / Self Care | Attending: Family Medicine | Admitting: Family Medicine

## 2023-09-23 ENCOUNTER — Other Ambulatory Visit: Payer: Self-pay

## 2023-09-23 DIAGNOSIS — L03115 Cellulitis of right lower limb: Secondary | ICD-10-CM | POA: Insufficient documentation

## 2023-09-23 DIAGNOSIS — M79604 Pain in right leg: Secondary | ICD-10-CM | POA: Diagnosis present

## 2023-09-23 MED ORDER — DOXYCYCLINE HYCLATE 100 MG PO CAPS: 100.0000 mg | ORAL_CAPSULE | Freq: Two times a day (BID) | ORAL | 0 refills | Status: DC

## 2023-09-23 NOTE — ED Provider Notes (Signed)
 Michele Herrera    CSN: [id number] Arrival date & time: 09/23/23  1707      History   Chief Complaint Chief Complaint  Patient presents with   Leg Pain    HPI Michele Herrera is a 48 y.o. female.   Patient has a wound on the back of her right lower leg that is enlarging since Monday.  She states that it is now Thursday and it is so painful she cannot wait to see her PCP tomorrow.  She is here this evening for evaluation.  She states that she did not start off with any kind of wound or bite.  She states she thinks it is a boil.  There was finally a small yellow top on it, when she opened it up nothing came out.  She wonders if it needs to be lanced.  No fever chills or bodyaches.    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      Home Medications    Prior to Admission medications   Medication Sig Start Date End Date Taking? Authorizing Provider   doxycycline  (VIBRAMYCIN ) 100 MG capsule Take 1 capsule (100 mg total) by mouth 2 (two) times daily. 09/23/23  Yes Maranda Jamee Jacob, MD  acetaminophen (TYLENOL) 650 MG CR tablet Take 650 mg by mouth 4 (four) times daily.    [provider]  celecoxib  (CELEBREX ) 100 MG capsule Take 100 mg by mouth daily. 07/23/22   [provider]  DULoxetine  (CYMBALTA ) 30 MG capsule TAKE 1 CAPSULE BY MOUTH EVERY DAY 08/23/23   Alvia Bring, DO  hydroxychloroquine  (PLAQUENIL ) 200 MG tablet TAKE 200MG  BY MOUTH TWICE DAILY, MONDAY THROUGH FRIDAY ONLY. NONE ON SATURDAY OR SUNDAY. 08/18/23   Dolphus Reiter, MD  ondansetron  (ZOFRAN -ODT) 4 MG disintegrating tablet Take 1 tablet (4 mg total) by mouth every 8 (eight) hours as needed for nausea or vomiting. 12/30/22   Alvia Bring, DO  tiZANidine  (ZANAFLEX ) 4 MG tablet TAKE 1 TABLET BY MOUTH EVERY 6 HOURS AS NEEDED FOR MUSCLE SPASMS. 08/23/23   Alvia Bring, DO  traMADol  (ULTRAM ) 50 MG tablet TAKE 1 TABLET BY MOUTH EVERY 8 HOURS AS NEEDED. 01/12/23   Alvia Bring, DO    Family History Family History  Problem Relation Age of Onset   Cancer Mother    Hypertension Mother  Arthritis Mother    COPD Mother    Early death Mother    Hypertension Father    Cancer Maternal Grandmother    Cancer Maternal Grandfather    Hypertension Maternal Grandfather    Cancer Paternal Grandmother    Hypertension Paternal Grandmother    Heart disease Paternal Grandmother    Hearing loss Paternal Grandmother    Cancer Paternal Grandfather    Coronary artery disease Other    Cancer Other    Arthritis Other    ADD / ADHD Daughter     Social History Social History   Tobacco Use   Smoking status: Former    Current packs/day: 0.00    Types: Cigarettes    Quit date: 03/2022    Years since quitting: 1.5    Passive exposure: Past   Smokeless tobacco: Never  Vaping Use   Vaping status: Every Day  Substance Use Topics   Alcohol use: Yes    Comment:  ocassional   Drug use: No     Allergies   Patient has no known allergies.   Review of Systems Review of Systems See HPI  Physical Exam Triage Vital Signs ED Triage Vitals  Encounter Vitals Group     BP 09/23/23 1713 138/87     Girls Systolic BP Percentile --      Girls Diastolic BP Percentile --      Boys Systolic BP Percentile --      Boys Diastolic BP Percentile --      Pulse Rate 09/23/23 1713 88     Resp 09/23/23 1713 16     Temp 09/23/23 1713 99 F (37.2 C)     Temp src --      SpO2 09/23/23 1713 98 %     Weight --      Height --      Head Circumference --      Peak Flow --      Pain Score 09/23/23 1716 7     Pain Loc --      Pain Education --      Exclude from Growth Chart --    No data found.  Updated Vital Signs BP 138/87   Pulse 88   Temp 99 F (37.2 C)   Resp 16   SpO2 98%      Physical Exam Constitutional:      General: She is not in acute distress.    Appearance: She is well-developed.  HENT:     Head: Normocephalic and atraumatic.  Eyes:     Conjunctiva/sclera: Conjunctivae normal.     Pupils: Pupils are equal, round, and reactive to light.  Cardiovascular:     Rate and Rhythm: Normal rate.  Pulmonary:     Effort: Pulmonary effort is normal. No respiratory distress.  Abdominal:     General: There is no distension.     Palpations: Abdomen is soft.  Musculoskeletal:        General: Normal range of motion.     Cervical back: Normal range of motion.  Skin:    General: Skin is warm and dry.         Comments: The right calf has a deeply erythematous indurated region with a punctate center.  There was a small amount of purulence that was sent to the laboratory for culture.  Concern for MRSA  Neurological:     Mental Status: She is alert.      UC Treatments / Results  Labs (all labs  ordered are listed, but only abnormal results are displayed) Labs Reviewed - No data to display  EKG   Radiology No results  found.  Procedures Procedures (including critical Herrera time)  Medications Ordered in UC Medications - No data to display  Initial Impression / Assessment and Plan / UC Course  I have reviewed the triage vital signs and the nursing notes.  Pertinent labs & imaging results that were available during my Herrera of the patient were reviewed by me and considered in my medical decision making (see chart for details).     Will start antibiotics.  Patient states she has pain medication.  She does not want a work note. Final Clinical Impressions(s) / UC Diagnoses   Final diagnoses:  Right leg pain  Cellulitis of leg, right     Discharge Instructions      Take doxycycline  2 times a day with food.  Take 2 doses today, 1 now and then 1 at bedtime After this you can take it every 12 hours Warm compresses may help Take Tylenol or ibuprofen  for moderate pain.  Take tramadol  if needed for severe pain See your primary doctor in follow-up   ED Prescriptions     Medication Sig Dispense Auth. Provider   doxycycline  (VIBRAMYCIN ) 100 MG capsule Take 1 capsule (100 mg total) by mouth 2 (two) times daily. 20 capsule Maranda Jamee Jacob, MD      PDMP not reviewed this encounter.   Maranda Jamee Jacob, MD 09/23/23 [phone]

## 2023-09-23 NOTE — ED Triage Notes (Signed)
 C/o infection to back of right leg since Monday. Is now throbbing with pain. No fever. Tylenol and neosporin prn.

## 2023-09-23 NOTE — Telephone Encounter (Signed)
 Patient scheduled for 09/24/2023 with Jade Breeback,PA

## 2023-09-23 NOTE — Discharge Instructions (Addendum)
 Take doxycycline  2 times a day with food.  Take 2 doses today, 1 now and then 1 at bedtime After this you can take it every 12 hours Warm compresses may help Take Tylenol or ibuprofen  for moderate pain.  Take tramadol  if needed for severe pain See your primary doctor in follow-up

## 2023-09-23 NOTE — Telephone Encounter (Signed)
 FYI Only or Action Required?: FYI only for provider.  Patient was last seen in primary care on 09/08/2023 by Alvia Bring, DO.  Called Nurse Triage reporting Mass.  Symptoms began yesterday.  Interventions attempted: Ice/heat application.  Symptoms are: stable.  Triage Disposition: See Physician Within 24 Hours Scheduled   Patient/caregiver understands and will follow disposition?: Yes       Copied from CRM [id number]. Topic: Appointments - Red Word Triage >> Sep 23, 2023  9:32 AM Farrel B wrote: Patient is calling stating that she's had a boil on the right leg (calf) this morning it is extremely painful and hot to touch. Reason for Disposition  Boil > 2 inches across (> 5 cm; larger than a golf ball or ping pong ball)  Answer Assessment - Initial Assessment Questions 1. APPEARANCE of BOIL: What does the boil look like?      =---- Red and elevated    2. LOCATION: Where is the boil located?      ------ R. Calf    3. NUMBER: How many boils are there?     -------------- One   4. SIZE: How big is the boil? (e.g., inches, cm; compare to size of a coin or other object)     --------------------------- Cort Lam ball    5. ONSET: When did the boil start?     Sunday 7/06  6. PAIN: Is there any pain? If Yes, ask: How bad is the pain?   (Scale 1-10; or mild, moderate, severe)     --------------------------------------- 6/10   7. FEVER: Do you have a fever? If Yes, ask: What is it, how was it measured, and when did it start?     ---------------------- Denies    8. SOURCE: Have you been around anyone with boils or other Staph infections? Have you ever had boils before?     -------------------Yes, once prior ( in a different area )    9. OTHER SYMPTOMS: Do you have any other symptoms? (e.g., shaking chills, weakness, rash elsewhere on body)     --- Redness and tender to touch     Infor: --Hot compress: Drained a little.  Protocols used: Boil  (Skin Abscess)-A-AH

## 2023-09-24 ENCOUNTER — Ambulatory Visit (INDEPENDENT_AMBULATORY_CARE_PROVIDER_SITE_OTHER): Admitting: Physician Assistant

## 2023-09-24 ENCOUNTER — Encounter: Payer: Self-pay | Admitting: Physician Assistant

## 2023-09-24 VITALS — BP 127/78 | HR 80 | Temp 100.9°F | Ht 64.0 in | Wt 214.0 lb

## 2023-09-24 DIAGNOSIS — S80861A Insect bite (nonvenomous), right lower leg, initial encounter: Secondary | ICD-10-CM | POA: Insufficient documentation

## 2023-09-24 DIAGNOSIS — W57XXXD Bitten or stung by nonvenomous insect and other nonvenomous arthropods, subsequent encounter: Secondary | ICD-10-CM | POA: Diagnosis not present

## 2023-09-24 DIAGNOSIS — S80861D Insect bite (nonvenomous), right lower leg, subsequent encounter: Secondary | ICD-10-CM

## 2023-09-24 DIAGNOSIS — L03115 Cellulitis of right lower limb: Secondary | ICD-10-CM | POA: Insufficient documentation

## 2023-09-24 NOTE — Patient Instructions (Addendum)

## 2023-09-24 NOTE — Progress Notes (Signed)
 Acute Office Visit  Subjective:     Patient ID: Michele Herrera, female    DOB: 12/22/1975, 48 y.o.   MRN: [id number]  Chief Complaint  Patient presents with   Insect Bite    Patient is in today for a lower leg infection from a suspected insect/bug bite on her right calf, measuring 0.5cm circular weeping wound with 4 x 6 cm of surrounding erythema.  Patient states she noticed what she thought was an ingrown hair or insect bite on her right calf on Sunday and it continued to swell, become increasingly tender and red. Patient has been doing warm compresses, cleaning the area with hydrogen peroxide, and putting neosporin on the area with no improvement. Patient has taken tylenol and tramadol  (prescribed) with no relief of the pain. She states that standing or sitting for extended periods will worsen the swelling and pain.   Last night the area came to a small 'white head', but there was no drainage from the area. She was in so much pain last night (09/23/2023) that she could not wait for her appointment today and went to urgent care, where they cultured the wound and started her on doxycycline  (100mg  BID x10 days), she started taking this last night. Patient states that the area appears to be slightly better than it was yesterday, but still admits to pain and swelling of the area. Patient is febrile today with a temperature of 100.9.  Review of Systems  Constitutional:  Negative for chills, fever and malaise/fatigue.  Respiratory:  Negative for shortness of breath.   Gastrointestinal:  Negative for nausea and vomiting.  All other systems reviewed and are negative.      Objective:    BP 127/78   Pulse 80   Temp (!) 100.9 F (38.3 C) (Oral)   Ht 5' 4 (1.626 m)   Wt 214 lb (97.1 kg)   SpO2 99%   BMI 36.73 kg/m  BP Readings from Last 3 Encounters:  09/24/23 127/78  09/23/23 138/87  09/08/23 111/75     Physical Exam Vitals reviewed.  Constitutional:      Appearance: Normal  appearance.  Skin:    General: Skin is warm.     Findings: Erythema and lesion present.         Comments: Right calf, 4x6cm redness, swelling, induration, and tender to palpation. Very minimal serosanguinous drainage coming from center.   Neurological:     Mental Status: She is alert.          Assessment & Plan:  [name] was seen today for insect bite.  Diagnoses and all orders for this visit:  Cellulitis of right lower extremity  Insect bite of right lower leg, subsequent encounter   Cellulitis of the RLE- suspected bug bite  - Urgent care started Doxycycline  100mg  BID x10 days, recommended patient takes medication with food - Wound culture taken by UC still pending-will adjust medication if needed based on culture - No labs drawn today because of suspected results based on PE and this would not change treatment plan at this time. - Outlined the area of induration/ erythema on the leg, educated patient on importance of watching this area for any spreading of the redness beyond the outline  - Educated patient to keep area clean (recommended Vashe wound cleaning solution), keeping the leg elevated, and massaging the area after warm compress to help with swelling and with efficacy of the antibiotic) - Educated patient to contact the office for results of  culture taken at UC/ we will contact her if there is any need to change the current antibiotic she was prescribed.  - Educated patient on signs/ symptoms of sepsis and to go to the ED (fever rises about 101.6, she begins to vomit, feel extreme malaise/lethargy, spreading of redness or swelling of lower leg) Pt does have fever today but no tachycardia  If not continuing to improve please go to ED and/or call our office for further guidance.     Jaquasia Doscher, PA-C

## 2023-09-25 ENCOUNTER — Other Ambulatory Visit: Payer: Self-pay | Admitting: Family Medicine

## 2023-09-26 ENCOUNTER — Other Ambulatory Visit: Payer: Self-pay | Admitting: Family Medicine

## 2023-09-26 DIAGNOSIS — G8929 Other chronic pain: Secondary | ICD-10-CM

## 2023-09-27 ENCOUNTER — Telehealth: Payer: Self-pay

## 2023-09-27 LAB — AEROBIC CULTURE W GRAM STAIN (SUPERFICIAL SPECIMEN): Gram Stain: NONE SEEN

## 2023-09-27 NOTE — Telephone Encounter (Unsigned)
 Copied from CRM [id number]. Topic: Clinical - Lab/Test Results >> Sep 27, 2023 12:25 PM Cherylann RAMAN wrote: Reason for CRM: Patient is requesting a callback regarding labs that were completed on Friday 09/24/23. Did not see any notes from provider. Patient states that she is at work and that it is okay to leave a detailed message on her VM Please contact back at [phone]

## 2023-09-29 MED ORDER — TIZANIDINE HCL 4 MG PO TABS
4.0000 mg | ORAL_TABLET | Freq: Four times a day (QID) | ORAL | 0 refills | Status: DC | PRN
Start: 2023-09-29 — End: 2023-11-03

## 2023-09-29 NOTE — Addendum Note (Signed)
 Addended by: Adryana Mogensen P on: 09/29/2023 08:27 AM   Modules accepted: Orders

## 2023-09-29 NOTE — Telephone Encounter (Signed)
 Requesting rx rf of Tizanidine  Last written 08/23/2023 Last OV 09/24/2023 Upcoming appt = none

## 2023-10-04 ENCOUNTER — Other Ambulatory Visit: Payer: Self-pay | Admitting: Family Medicine

## 2023-10-04 DIAGNOSIS — M351 Other overlap syndromes: Secondary | ICD-10-CM

## 2023-10-26 ENCOUNTER — Other Ambulatory Visit: Payer: Self-pay | Admitting: Family Medicine

## 2023-10-28 ENCOUNTER — Encounter: Payer: Self-pay | Admitting: Family Medicine

## 2023-10-28 ENCOUNTER — Other Ambulatory Visit: Payer: Self-pay | Admitting: Family Medicine

## 2023-10-28 DIAGNOSIS — G8929 Other chronic pain: Secondary | ICD-10-CM

## 2023-11-02 ENCOUNTER — Encounter: Payer: Self-pay | Admitting: Family Medicine

## 2023-11-03 ENCOUNTER — Ambulatory Visit
Admission: EM | Admit: 2023-11-03 | Discharge: 2023-11-03 | Disposition: A | Discharge status: Home / Self Care | Attending: Emergency Medicine | Admitting: Emergency Medicine

## 2023-11-03 ENCOUNTER — Telehealth: Payer: Self-pay

## 2023-11-03 ENCOUNTER — Other Ambulatory Visit: Payer: Self-pay

## 2023-11-03 DIAGNOSIS — L03115 Cellulitis of right lower limb: Secondary | ICD-10-CM | POA: Insufficient documentation

## 2023-11-03 MED ORDER — MUPIROCIN 2 % EX OINT: 1.0000 | TOPICAL_OINTMENT | Freq: Two times a day (BID) | CUTANEOUS | 0 refills | Status: DC

## 2023-11-03 MED ORDER — DOXYCYCLINE HYCLATE 100 MG PO CAPS: 100.0000 mg | ORAL_CAPSULE | Freq: Two times a day (BID) | ORAL | 0 refills | Status: DC

## 2023-11-03 NOTE — Telephone Encounter (Signed)
 Pt wants rx sent to [facility] in [place] since cannot pick up before her pharmacy closes. This was performed.

## 2023-11-03 NOTE — Discharge Instructions (Addendum)
 You have cellulitis of your right lower extremity.  Use your skin marker to track your healing.  Take the antibiotics twice daily with food and apply the Bactroban  ointment twice daily as well.  You can clean the area with warm water and a gentle soap such as Dial.   If the area extends beyond your wound borders, you develop fever, or worsening infection prior to your recheck on Sunday seek immediate care at the nearest emergency department as you may need IV antibiotics and further evaluation.  Return to clinic on Sunday for reevaluation to ensure proper healing.  Do not pick at the area.  Keep it covered if you go to be out and about.

## 2023-11-03 NOTE — ED Provider Notes (Signed)
 Michele Herrera    CSN: [id number] Arrival date & time: 11/03/23  1833      History   Chief Complaint Chief Complaint  Patient presents with   Wound Infection    HPI Michele Herrera is a 48 y.o. female.   Patient presents to clinic over concern of painful, erythematous and leaking open wound to her right lower leg on her calf that started around 6 or 7 days ago.  She recently completed doxycycline  for very similar infection slightly distal to the current site.  Reports the area had fully healed.  Also has a spot she is concerned about to her anterior shin that is leaking purulent fluid, covered with a Band-Aid.  Reports a fever of 99 this morning.  Has been taking ibuprofen  throughout the day.  Has not had any streaking.  The history is provided by the patient and medical records.    Past Medical History:  Diagnosis Date   Anxiety 2000   I do not know an exact date   Arthritis 2014   This is when i was diagnosed   Bronchitis    Colon cancer screening 07/10/2021   GERD (gastroesophageal reflux disease) 1993   Lupus (systemic lupus erythematosus) (HCC)    Pneumonia    Seasonal allergies     Patient Active Problem List   Diagnosis Date Noted   Cellulitis of right lower extremity 09/24/2023   Insect bite of right lower leg 09/24/2023   Abnormal uterine bleeding (AUB) 10/28/2022   Adjustment disorder 09/02/2022   SLE (systemic lupus erythematosus related syndrome) (HCC) 07/29/2022   Elevated antinuclear antibody (ANA) level 07/17/2022   Acute pain of both shoulders 06/29/2022   Chest pain 04/08/2022   Chronic joint pain 07/10/2021   Other fatigue 07/10/2021   Colon cancer screening 07/10/2021   Nicotine dependence 07/10/2021   LUMBAR RADICULOPATHY 11/13/2008   BREAST MASS, LEFT 02/03/2008    Past Surgical History:  Procedure Laterality Date   TEMPOROMANDIBULAR JOINT SURGERY     TMJ surgeries     TONSILLECTOMY     TUBAL LIGATION  06/1997    OB  History   No obstetric history on file.      Home Medications    Prior to Admission medications   Medication Sig Start Date End Date Taking? Authorizing Provider  doxycycline  (VIBRAMYCIN ) 100 MG capsule Take 1 capsule (100 mg total) by mouth 2 (two) times daily. 11/03/23  Yes Sunday Klos  N, FNP  mupirocin  ointment (BACTROBAN ) 2 % Apply 1 Application topically 2 (two) times daily. 11/03/23  Yes Hashem Goynes  N, FNP  acetaminophen (TYLENOL) 650 MG CR tablet Take 650 mg by mouth 4 (four) times daily.    [provider]  celecoxib  (CELEBREX ) 100 MG capsule Take 100 mg by mouth daily. 07/23/22   [provider]  DULoxetine  (CYMBALTA ) 30 MG capsule TAKE 1 CAPSULE BY MOUTH EVERY DAY 10/26/23   Alvia Bring, DO  hydroxychloroquine  (PLAQUENIL ) 200 MG tablet TAKE 1 TABLET BY MOUTH EVERY DAY 10/05/23   Dolphus Reiter, MD  ondansetron  (ZOFRAN -ODT) 4 MG disintegrating tablet Take 1 tablet (4 mg total) by mouth every 8 (eight) hours as needed for nausea or vomiting. 12/30/22   Alvia Bring, DO  tiZANidine  (ZANAFLEX ) 4 MG tablet TAKE 1 TABLET BY MOUTH EVERY 6 HOURS AS NEEDED FOR MUSCLE SPASMS. 11/03/23   Alvia Bring, DO  traMADol  (ULTRAM ) 50 MG tablet TAKE 1 TABLET BY MOUTH EVERY 8 HOURS AS NEEDED. 01/12/23   Alvia,  Velma, DO    Family History Family History  Problem Relation Age of Onset   Cancer Mother    Hypertension Mother    Arthritis Mother    COPD Mother    Early death Mother    Hypertension Father    Cancer Maternal Grandmother    Cancer Maternal Grandfather    Hypertension Maternal Grandfather    Cancer Paternal Grandmother    Hypertension Paternal Grandmother    Heart disease Paternal Grandmother    Hearing loss Paternal Grandmother    Cancer Paternal Grandfather    Coronary artery disease Other    Cancer Other    Arthritis Other    ADD / ADHD Daughter     Social History Social History   Tobacco Use   Smoking status: Former    Current  packs/day: 0.00    Types: Cigarettes    Quit date: 03/2022    Years since quitting: 1.6    Passive exposure: Past   Smokeless tobacco: Never  Vaping Use   Vaping status: Every Day  Substance Use Topics   Alcohol use: Yes    Comment: ocassional   Drug use: No     Allergies   Patient has no known allergies.   Review of Systems Review of Systems  Per HPI  Physical Exam Triage Vital Signs ED Triage Vitals  Encounter Vitals Group     BP 11/03/23 1905 136/85     Girls Systolic BP Percentile --      Girls Diastolic BP Percentile --      Boys Systolic BP Percentile --      Boys Diastolic BP Percentile --      Pulse Rate 11/03/23 1905 95     Resp 11/03/23 1905 16     Temp 11/03/23 1905 98.3 F (36.8 C)     Temp src --      SpO2 11/03/23 1905 97 %     Weight --      Height --      Head Circumference --      Peak Flow --      Pain Score 11/03/23 1908 6     Pain Loc --      Pain Education --      Exclude from Growth Chart --    No data found.  Updated Vital Signs BP 136/85   Pulse 95   Temp 98.3 F (36.8 C)   Resp 16   SpO2 97%   Visual Acuity Right Eye Distance:   Left Eye Distance:   Bilateral Distance:    Right Eye Near:   Left Eye Near:    Bilateral Near:     Physical Exam Vitals and nursing note reviewed.  Constitutional:      Appearance: Normal appearance.  HENT:     Head: Normocephalic and atraumatic.     Right Ear: External ear normal.     Left Ear: External ear normal.     Nose: Nose normal.     Mouth/Throat:     Mouth: Mucous membranes are moist.  Eyes:     Conjunctiva/sclera: Conjunctivae normal.  Cardiovascular:     Rate and Rhythm: Normal rate.  Pulmonary:     Effort: Pulmonary effort is normal. No respiratory distress.  Skin:    General: Skin is warm and dry.      Neurological:     General: No focal deficit present.     Mental Status: She is alert and oriented to person, place,  and time.  Psychiatric:        Mood and Affect:  Mood normal.        Behavior: Behavior normal. Behavior is cooperative.     Media Information   Document Information     Media Information     UC Treatments / Results  Labs (all labs ordered are listed, but only abnormal results are displayed) Labs Reviewed  AEROBIC CULTURE W GRAM STAIN (SUPERFICIAL SPECIMEN)    EKG   Radiology No results found.  Procedures Procedures (including critical Herrera time)  Medications Ordered in UC Medications - No data to display  Initial Impression / Assessment and Plan / UC Course  I have reviewed the triage vital signs and the nursing notes.  Pertinent labs & imaging results that were available during my Herrera of the patient were reviewed by me and considered in my medical decision making (see chart for details).  Vitals and triage reviewed, patient is hemodynamically stable.  Cellulitis of the right lower extremity, multiple wound sites.  See media for further details.  Wound culture sent.  Will restart doxycycline .  Proper wound Herrera discussed.  Patient has a skin marker and will return to clinic 1 Sunday for repeat evaluation to ensure proper wound healing.  Strict emergency precautions given if fever or systemic  symptoms develop, she may be a candidate for IV antibiotics.  Plan of Herrera, follow-up Herrera return precautions given, no questions at this time.    Final Clinical Impressions(s) / UC Diagnoses   Final diagnoses:  Cellulitis of right lower extremity     Discharge Instructions      You have cellulitis of your right lower extremity.  Use your skin marker to track your healing.  Take the antibiotics twice daily with food and apply the Bactroban  ointment twice daily as well.  You can clean the area with warm water and a gentle soap such as Dial.   If the area extends beyond your wound borders, you develop fever, or worsening infection prior to your recheck on Sunday seek immediate Herrera at the nearest emergency department  as you may need IV antibiotics and further evaluation.  Return to clinic on Sunday for reevaluation to ensure proper healing.  Do not pick at the area.  Keep it covered if you go to be out and about.     ED Prescriptions     Medication Sig Dispense Auth. Provider   doxycycline  (VIBRAMYCIN ) 100 MG capsule Take 1 capsule (100 mg total) by mouth 2 (two) times daily. 20 capsule Dreama, Farooq Petrovich  N, FNP   mupirocin  ointment (BACTROBAN ) 2 % Apply 1 Application topically 2 (two) times daily. 22 g Dreama, Maida Widger  N, FNP      PDMP not reviewed this encounter.   Dreama, Mariusz Jubb  N, FNP 11/03/23 [phone]

## 2023-11-03 NOTE — ED Triage Notes (Signed)
 Right leg open wounds since Thursday/Friday of last week. Just started hurting yesterday, before that was itchy. Area is red around open areas. Pus noted on bandage. Has had fever. Has been taking ibuprofen .

## 2023-11-04 ENCOUNTER — Telehealth: Payer: Self-pay

## 2023-11-04 MED ORDER — DOXYCYCLINE HYCLATE 100 MG PO CAPS: 100.0000 mg | ORAL_CAPSULE | Freq: Two times a day (BID) | ORAL | 0 refills | Status: DC

## 2023-11-04 MED ORDER — MUPIROCIN 2 % EX OINT: 1.0000 | TOPICAL_OINTMENT | Freq: Two times a day (BID) | CUTANEOUS | 0 refills | Status: DC

## 2023-11-04 NOTE — Telephone Encounter (Signed)
 Needs rx sent to [facility]

## 2023-11-06 LAB — AEROBIC CULTURE W GRAM STAIN (SUPERFICIAL SPECIMEN): Gram Stain: NONE SEEN

## 2023-11-07 ENCOUNTER — Ambulatory Visit: Admission: EM | Admit: 2023-11-07 | Discharge: 2023-11-07 | Disposition: A | Discharge status: Home / Self Care

## 2023-11-07 ENCOUNTER — Other Ambulatory Visit: Payer: Self-pay

## 2023-11-07 DIAGNOSIS — Z5189 Encounter for other specified aftercare: Secondary | ICD-10-CM | POA: Diagnosis not present

## 2023-11-07 DIAGNOSIS — L03115 Cellulitis of right lower limb: Secondary | ICD-10-CM | POA: Diagnosis not present

## 2023-11-07 NOTE — Discharge Instructions (Addendum)
 Advised patient to keep wound covered dry and clean.  Instructed patient to discontinue topical Bactroban  and continue doxycycline  to completion.  Advised patient to follow-up with PCP tomorrow morning first thing, 11/08/2023 for wound care referral [facility] wound care contact information provided to patient prior to discharge) for further evaluation of worsening cellulitis of right lower leg.  Encouraged patient to increase daily water intake to 64 ounces per day while taking doxycycline .

## 2023-11-07 NOTE — ED Notes (Signed)
 Wound dressed with telfa kerlex and coban per ragan Nps orders

## 2023-11-07 NOTE — ED Provider Notes (Signed)
 Michele Herrera CARE    CSN: [id number] Arrival date & time: 11/07/23  1043      History   Chief Complaint Chief Complaint  Patient presents with   Wound Check    HPI Michele Herrera is a 48 y.o. female.   HPI 48 year old female presents with wound recheck of right lower leg.  Patient evaluated here for cellulitis of right lower leg on 11/03/2023 please see epic for that encounter note.  PMH significant for lupus, cellulitis of right lower extremity, and chronic joint pain.  Past Medical History:  Diagnosis Date   Anxiety 2000   I do not know an exact date   Arthritis 2014   This is when i was diagnosed   Bronchitis    Colon cancer screening 07/10/2021   GERD (gastroesophageal reflux disease) 1993   Lupus (systemic lupus erythematosus) (HCC)    Pneumonia    Seasonal allergies     Patient Active Problem List   Diagnosis Date Noted   Cellulitis of right lower extremity 09/24/2023   Insect bite of right lower leg 09/24/2023   Abnormal uterine bleeding (AUB) 10/28/2022   Adjustment disorder 09/02/2022   SLE (systemic lupus erythematosus related syndrome) (HCC) 07/29/2022   Elevated antinuclear antibody (ANA) level 07/17/2022   Acute pain of both shoulders 06/29/2022   Chest pain 04/08/2022   Chronic joint pain 07/10/2021   Other fatigue 07/10/2021   Colon cancer screening 07/10/2021   Nicotine dependence 07/10/2021   LUMBAR RADICULOPATHY 11/13/2008   BREAST MASS, LEFT 02/03/2008    Past Surgical History:  Procedure Laterality Date   TEMPOROMANDIBULAR JOINT SURGERY     TMJ surgeries     TONSILLECTOMY     TUBAL LIGATION  06/1997    OB History   No obstetric history on file.      Home Medications    Prior to Admission medications   Medication Sig Start Date End Date Taking? Authorizing Provider  acetaminophen (TYLENOL) 650 MG CR tablet Take 650 mg by mouth 4 (four) times daily.    [provider]  celecoxib  (CELEBREX ) 100 MG capsule Take  100 mg by mouth daily. 07/23/22   [provider]  doxycycline  (VIBRAMYCIN ) 100 MG capsule Take 1 capsule (100 mg total) by mouth 2 (two) times daily. 11/04/23   Dreama, [place]  N, FNP  DULoxetine  (CYMBALTA ) 30 MG capsule TAKE 1 CAPSULE BY MOUTH EVERY DAY 10/26/23   Alvia Bring, DO  hydroxychloroquine  (PLAQUENIL ) 200 MG tablet TAKE 1 TABLET BY MOUTH EVERY DAY 10/05/23   Dolphus Reiter, MD  mupirocin  ointment (BACTROBAN ) 2 % Apply 1 Application topically 2 (two) times daily. 11/04/23   Dreama, [place]  N, FNP  ondansetron  (ZOFRAN -ODT) 4 MG disintegrating tablet Take 1 tablet (4 mg total) by mouth every 8 (eight) hours as needed for nausea or vomiting. 12/30/22   Alvia Bring, DO  tiZANidine  (ZANAFLEX ) 4 MG tablet TAKE 1 TABLET BY MOUTH EVERY 6 HOURS AS NEEDED FOR MUSCLE SPASMS. 11/03/23   Alvia Bring, DO  traMADol  (ULTRAM ) 50 MG tablet TAKE 1 TABLET BY MOUTH EVERY 8 HOURS AS NEEDED. 01/12/23   Alvia Bring, DO    Family History Family History  Problem Relation Age of Onset   Cancer Mother    Hypertension Mother    Arthritis Mother    COPD Mother    Early death Mother    Hypertension Father    Cancer Maternal Grandmother    Cancer Maternal Grandfather    Hypertension Maternal Grandfather  Cancer Paternal Grandmother    Hypertension Paternal Grandmother    Heart disease Paternal Grandmother    Hearing loss Paternal Grandmother    Cancer Paternal Grandfather    Coronary artery disease Other    Cancer Other    Arthritis Other    ADD / ADHD Daughter     Social History Social History   Tobacco Use   Smoking status: Former    Current packs/day: 0.00    Types: Cigarettes    Quit date: 03/2022    Years since quitting: 1.6    Passive exposure: Past   Smokeless tobacco: Never  Vaping Use   Vaping status: Every Day  Substance Use Topics   Alcohol use: Yes    Comment: ocassional   Drug use: No     Allergies   Patient has no known allergies.   Review of  Systems Review of Systems  Skin:  Positive for wound.     Physical Exam Triage Vital Signs ED Triage Vitals  Encounter Vitals Group     BP 11/07/23 1103 (!) 149/89     Girls Systolic BP Percentile --      Girls Diastolic BP Percentile --      Boys Systolic BP Percentile --      Boys Diastolic BP Percentile --      Pulse Rate 11/07/23 1103 72     Resp 11/07/23 1103 19     Temp 11/07/23 1103 98.4 F (36.9 C)     Temp src --      SpO2 11/07/23 1103 98 %     Weight --      Height --      Head Circumference --      Peak Flow --      Pain Score 11/07/23 1102 0     Pain Loc --      Pain Education --      Exclude from Growth Chart --    No data found.  Updated Vital Signs BP (!) 149/89   Pulse 72   Temp 98.4 F (36.9 C)   Resp 19   SpO2 98%    Physical Exam Vitals and nursing note reviewed.  Constitutional:      Appearance: Normal appearance. She is normal weight.  HENT:     Head: Normocephalic and atraumatic.     Mouth/Throat:     Mouth: Mucous membranes are moist.     Pharynx: Oropharynx is clear.  Eyes:     Extraocular Movements: Extraocular movements intact.     Conjunctiva/sclera: Conjunctivae normal.     Pupils: Pupils are equal, round, and reactive to light.  Cardiovascular:     Rate and Rhythm: Normal rate and regular rhythm.     Pulses: Normal pulses.     Heart sounds: Normal heart sounds.  Pulmonary:     Effort: Pulmonary effort is normal.     Breath sounds: Normal breath sounds.  Musculoskeletal:        General: Normal range of motion.  Skin:    General: Skin is warm and dry.  Neurological:     General: No focal deficit present.     Mental Status: She is alert and oriented to person, place, and time.  Psychiatric:        Mood and Affect: Mood normal.        Behavior: Behavior normal.      UC Treatments / Results  Labs (all labs ordered are listed, but only abnormal results are  displayed) Labs Reviewed - No data to  display  EKG   Radiology No results found.  Procedures Procedures (including critical care time)  Medications Ordered in UC Medications - No data to display  Initial Impression / Assessment and Plan / UC Course  I have reviewed the triage vital signs and the nursing notes.  Pertinent labs & imaging results that were available during my care of the patient were reviewed by me and considered in my medical decision making (see chart for details).     MDM: 1.  Cellulitis of right lower leg-Advised patient to keep wound covered dry and clean.  Instructed patient to discontinue topical Bactroban  and continue doxycycline  to completion.  Advised patient to follow-up with PCP tomorrow morning first thing, 11/08/2023 for wound care referral [facility] wound care contact information provided to patient prior to discharge) for further evaluation of worsening cellulitis of right lower leg.  Encouraged patient to increase daily water intake to 64 ounces per day while taking doxycycline .  2.  Wound check, abscess-please see image above.  Instructions provided to patient.  Patient discharged home, hemodynamically stable. Final Clinical Impressions(s) / UC Diagnoses   Final diagnoses:  Wound check, abscess  Cellulitis of right lower leg     Discharge Instructions      Advised patient to keep wound covered dry and clean.  Instructed patient to discontinue topical Bactroban  and continue doxycycline  to completion.  Advised patient to follow-up with PCP tomorrow morning first thing, 11/08/2023 for wound care referral [facility] wound care contact information provided to patient prior to discharge) for further evaluation of worsening cellulitis of right lower leg.  Encouraged patient to increase daily water intake to 64 ounces per day while taking doxycycline .     ED Prescriptions   None    PDMP not reviewed this encounter.   Teddy Sharper, FNP 11/07/23 1156

## 2023-11-07 NOTE — ED Triage Notes (Signed)
 Pt presents to uc with need for wound check.

## 2023-11-08 ENCOUNTER — Telehealth: Payer: Self-pay | Admitting: Family Medicine

## 2023-11-08 NOTE — Telephone Encounter (Signed)
 Copied from CRM #[id number]. Topic: Referral - Request for Referral >> Nov 08, 2023 11:36 AM Carmell SAUNDERS wrote: Did the patient discuss referral with their provider in the last year? No (If No - schedule appointment) (If Yes - send message)  Appointment offered? Yes  Type of order/referral and detailed reason for visit: wound care specialist for cellulitis of right calf (infection)  Preference of office, provider, location: Dr Mliss in [place]. See notes from Urgent care visit from 11/07/23  If referral order, have you been seen by this specialty before? No (If Yes, this issue or another issue? When? Where?  Can we respond through MyChart? Yes

## 2023-11-10 ENCOUNTER — Encounter: Payer: Self-pay | Admitting: Family Medicine

## 2023-11-10 ENCOUNTER — Ambulatory Visit (INDEPENDENT_AMBULATORY_CARE_PROVIDER_SITE_OTHER): Admitting: Family Medicine

## 2023-11-10 VITALS — BP 144/79 | HR 88 | Ht 64.0 in | Wt 220.0 lb

## 2023-11-10 DIAGNOSIS — M329 Systemic lupus erythematosus, unspecified: Secondary | ICD-10-CM | POA: Diagnosis not present

## 2023-11-10 DIAGNOSIS — L97921 Non-pressure chronic ulcer of unspecified part of left lower leg limited to breakdown of skin: Secondary | ICD-10-CM | POA: Insufficient documentation

## 2023-11-10 NOTE — Assessment & Plan Note (Signed)
 Referral placed to wound care as well as dermatology since she is having recurrent lesions.  Question autoimmune process.  Surrounding cellulitis appears to be resolving and I recommended she complete course of doxycycline .

## 2023-11-10 NOTE — Progress Notes (Signed)
 Imaging [name] - 48 y.o. female MRN [id number]  Date of birth: [birth year]  Subjective Chief Complaint  Patient presents with   Wound Check    HPI Michele Herrera is a 48 y.o. female here today for follow-up of urgent care visit.  She has ulceration along the posterior right calf.  This been present for a couple weeks.  Seen in urgent care and recommended follow-up with PCP for referral to wound care.  She was started on doxycycline  for coverage of associated cellulitis as well.  Tolerating doxycycline  well on current strength.  Pain and redness have improved.  She denies fever or chills.  She has had other areas that are popped up on the leg over the past few months as well.  There is question of possible a SLE and she is seeing rheumatology for this.  ROS:  A comprehensive ROS was completed and negative except as noted per HPI  No Known Allergies  Past Medical History:  Diagnosis Date   Anxiety 2000   I do not know an exact date   Arthritis 2014   This is when i was diagnosed   Bronchitis    Colon cancer screening 07/10/2021   GERD (gastroesophageal reflux disease) 1993   Lupus (systemic lupus erythematosus) (HCC)    Pneumonia    Seasonal allergies     Past Surgical History:  Procedure Laterality Date   TEMPOROMANDIBULAR JOINT SURGERY     TMJ surgeries     TONSILLECTOMY     TUBAL LIGATION  06/1997    Social History   Socioeconomic History   Marital status: Divorced    Spouse name: Not on file   Number of children: 2   Years of education: Not on file   Highest education level: Associate degree: occupational, Scientist, product/process development, or vocational program  Occupational History   Not on file  Tobacco Use   Smoking status: Former    Current packs/day: 0.00    Types: Cigarettes    Quit date: 03/2022    Years since quitting: 1.6    Passive exposure: Past   Smokeless tobacco: Never  Vaping Use   Vaping status: Every Day  Substance and Sexual Activity   Alcohol use:  Yes    Comment: ocassional   Drug use: No   Sexual activity: Yes    Birth control/protection: None  Other Topics Concern   Not on file  Social History Narrative   Not on file   Social Drivers of Health   Financial Resource Strain: High Risk (09/08/2023)   Overall Financial Resource Strain (CARDIA)    Difficulty of Paying Living Expenses: Very hard  Food Insecurity: Food Insecurity Present (09/08/2023)   Hunger Vital Sign    Worried About Running Out of Food in the Last Year: Often true    Ran Out of Food in the Last Year: Sometimes true  Transportation Needs: No Transportation Needs (09/08/2023)   PRAPARE - Administrator, Civil Service (Medical): No    Lack of Transportation (Non-Medical): No  Physical Activity: Insufficiently Active (12/29/2022)   Exercise Vital Sign    Days of Exercise per Week: 3 days    Minutes of Exercise per Session: 30 min  Stress: Stress Concern Present (09/08/2023)   Michele Herrera    Feeling of Stress: To some extent  Social Connections: Socially Isolated (09/08/2023)   Social Connection and Isolation Panel    Frequency of Communication with Friends  and Family: Never    Frequency of Social Gatherings with Friends and Family: Never    Attends Religious Services: Never    Database administrator or Organizations: Yes    Attends Banker Meetings: Never    Marital Status: Divorced    Family History  Problem Relation Age of Onset   Cancer Mother    Hypertension Mother    Arthritis Mother    COPD Mother    Early death Mother    Hypertension Father    Cancer Maternal Grandmother    Cancer Maternal Grandfather    Hypertension Maternal Grandfather    Cancer Paternal Grandmother    Hypertension Paternal Grandmother    Heart disease Paternal Grandmother    Hearing loss Paternal Grandmother    Cancer Paternal Grandfather    Coronary artery disease Other    Cancer  Other    Arthritis Other    ADD / ADHD Daughter     Health Maintenance  Topic Date Due   COVID-19 Vaccine (1) Never done   HIV Screening  Never done   Hepatitis C Screening  Never done   Hepatitis B Vaccines 19-59 Average Risk (1 of 3 - 19+ 3-dose series) Never done   Cervical Cancer Screening (HPV/Pap Cotest)  Never done   MAMMOGRAM  01/14/2023   INFLUENZA VACCINE  10/15/2023   Colonoscopy  10/09/2031   DTaP/Tdap/Td (2 - Td or Tdap) 09/07/2033   Pneumococcal Vaccine  Aged Out   HPV VACCINES  Aged Out   Meningococcal B Vaccine  Aged Out     ----------------------------------------------------------------------------------------------------------------------------------------------------------------------------------------------------------------- Physical Exam BP (!) 144/79 (BP Location: Left Arm, Patient Position: Sitting, Cuff Size: Large)   Pulse 88   Ht 5' 4 (1.626 m)   Wt 220 lb (99.8 kg)   SpO2 100%   BMI 37.76 kg/m   Physical Exam Constitutional:      Appearance: Normal appearance.  Skin:    Comments: Ulceration of the posterior right calf.  There is some slough present in the middle of the wound.  Mild erythema without significant tenderness.    There are a few scattered hyperpigmented lesions on the leg from healed areas.  Neurological:     Mental Status: She is alert.     ------------------------------------------------------------------------------------------------------------------------------------------------------------------------------------------------------------------- Assessment and Plan  Leg ulcer, left, limited to breakdown of skin [facility]) Referral placed to wound care as well as dermatology since she is having recurrent lesions.  Question autoimmune process.  Surrounding cellulitis appears to be resolving and I recommended she complete course of doxycycline .     No orders of the defined types were placed in this encounter.   No follow-ups  on file.

## 2023-12-05 ENCOUNTER — Other Ambulatory Visit: Payer: Self-pay | Admitting: Rheumatology

## 2023-12-05 DIAGNOSIS — M351 Other overlap syndromes: Secondary | ICD-10-CM

## 2023-12-06 NOTE — Telephone Encounter (Signed)
 Last Fill: 10/05/2023  Eye exam: 12/10/2022 (visual field), 09/02/2022 (OCT) both WNL, 08/12/2022 (comprehensive eye exam)   Labs: 09/08/2023 CBC and CMP: MCV 98, lymphocytes absolute 3.3  Next Visit: patient canceled June 2025 appointment.   Last Visit: 05/28/2023  DX: MCTD (mixed connective tissue disease)   Current Dose per office note on 05/28/2023: Will increase the dose of hydroxychloroquine  to 200 mg p.o. twice daily Monday to Friday.   Attempted to contact patient to verify what dose of PLQ she is taking, ask about updated PLQ eye exam, and also schedule a follow up appointment. Left message to advise patient to call the office.   Okay to refill 30 day supply of Plaquenil ?

## 2023-12-06 NOTE — Telephone Encounter (Signed)
 Patient contacted the office and states she has been taking the PLQ one tablet daily. Patient scheduled a follow up in our office for 12/29/2023. She will call to schedule her eye exam and call back to advise us  when it it scheduled for.

## 2023-12-08 ENCOUNTER — Encounter (INDEPENDENT_AMBULATORY_CARE_PROVIDER_SITE_OTHER): Payer: Self-pay | Admitting: Family Medicine

## 2023-12-08 DIAGNOSIS — M329 Systemic lupus erythematosus, unspecified: Secondary | ICD-10-CM

## 2023-12-15 NOTE — Progress Notes (Unsigned)
 Office Visit Note  Patient: AMILAH GREENSPAN             Date of Birth: [birth year]           MRN: [id number]             PCP: Alvia Bring, DO Referring: Alvia Bring, DO Visit Date: 12/29/2023 Occupation: Data Unavailable  Subjective:  Recent flare  History of Present Illness: Michele Herrera is a 48 y.o. female with history of mixed connective tissue disease.  Patient reports that she has been taking Plaquenil  200 mg 1 tablet by mouth daily.  Patient reports that for the past few prescriptions the dose of Plaquenil  was written for once daily instead of twice daily Monday through Friday.  Patient states that since mid September she has been experiencing symptoms of a flare.  Patient was evaluated by her PCP who restarted her on prednisone  5 mg 1 tablet daily in early October.  Patient would like to discuss increasing the dose of Plaquenil  since she felt that the higher dose was more effective at managing her symptoms.  Patient had an updated Plaquenil  eye examination today which was normal and will have results forwarded to our office.  Patient states that during this flare she has had increased fatigue, intermittent rashes, and increased generalized pain.  Patient has noticed an improvement in her energy level since taking prednisone . Patient states that she will be establishing care with Dr. Alm with [facility] dermatology on 02/07/2024.  Patient states that since mid June she has had recurrent cellulitis affecting the right lower leg.  Patient states that she was also found to have an ulcer leading to an abscess.  Patient states that she has no open wounds at this time but there is concern that these ulcers may be related to underlying autoimmune disease.     Activities of Daily Living:  Patient reports morning stiffness for 3 hours.   Patient Reports nocturnal pain.  Difficulty dressing/grooming: Reports Difficulty climbing stairs: Reports Difficulty getting out of chair:  Reports Difficulty using hands for taps, buttons, cutlery, and/or writing: Reports  Review of Systems  Constitutional:  Positive for fatigue.  HENT:  Positive for mouth sores and mouth dryness.   Eyes:  Positive for dryness.  Respiratory:  Negative for shortness of breath.   Cardiovascular:  Negative for chest pain and palpitations.  Gastrointestinal:  Positive for constipation and diarrhea. Negative for blood in stool.  Endocrine: Positive for increased urination.  Genitourinary:  Negative for involuntary urination.  Musculoskeletal:  Positive for joint pain, gait problem, joint pain, joint swelling, myalgias, muscle weakness, morning stiffness, muscle tenderness and myalgias.  Skin:  Positive for rash and sensitivity to sunlight. Negative for color change and hair loss.  Allergic/Immunologic: Positive for susceptible to infections.  Neurological:  Positive for dizziness. Negative for headaches.  Hematological:  Negative for swollen glands.  Psychiatric/Behavioral:  Positive for depressed mood and sleep disturbance. The patient is nervous/anxious.     PMFS History:  Patient Active Problem List   Diagnosis Date Noted   Leg ulcer, left, limited to breakdown of skin (HCC) 11/10/2023   Cellulitis of right lower extremity 09/24/2023   Insect bite of right lower leg 09/24/2023   Abnormal uterine bleeding (AUB) 10/28/2022   Adjustment disorder 09/02/2022   SLE (systemic lupus erythematosus related syndrome) (HCC) 07/29/2022   Elevated antinuclear antibody (ANA) level 07/17/2022   Acute pain of both shoulders 06/29/2022   Chest pain 04/08/2022   Chronic  joint pain 07/10/2021   Other fatigue 07/10/2021   Colon cancer screening 07/10/2021   Nicotine dependence 07/10/2021   LUMBAR RADICULOPATHY 11/13/2008   BREAST MASS, LEFT 02/03/2008    Past Medical History:  Diagnosis Date   Anxiety 2000   I do not know an exact date   Arthritis 2014   This is when i was diagnosed   Bronchitis     Colon cancer screening 07/10/2021   GERD (gastroesophageal reflux disease) 1993   Lupus (systemic lupus erythematosus) (HCC)    Pneumonia    Seasonal allergies     Family History  Problem Relation Age of Onset   Cancer Mother    Hypertension Mother    Arthritis Mother    COPD Mother    Early death Mother    Hypertension Father    Cancer Maternal Grandmother    Cancer Maternal Grandfather    Hypertension Maternal Grandfather    Cancer Paternal Grandmother    Hypertension Paternal Grandmother    Heart disease Paternal Grandmother    Hearing loss Paternal Grandmother    Cancer Paternal Grandfather    Coronary artery disease Other    Cancer Other    Arthritis Other    ADD / ADHD Daughter    Past Surgical History:  Procedure Laterality Date   TEMPOROMANDIBULAR JOINT SURGERY     TMJ surgeries     TONSILLECTOMY     TUBAL LIGATION  06/1997   Social History   Tobacco Use   Smoking status: Former    Current packs/day: 0.00    Types: Cigarettes    Quit date: 03/2022    Years since quitting: 1.7    Passive exposure: Past   Smokeless tobacco: Never  Vaping Use   Vaping status: Every Day  Substance Use Topics   Alcohol use: Yes    Comment: ocassional   Drug use: No   Social History   Social History Narrative   Not on file     Immunization History  Administered Date(s) Administered   PNEUMOCOCCAL CONJUGATE-20 09/08/2023   Tdap 09/08/2023     Objective: Vital Signs: BP 124/76   Pulse (!) 123   Temp 98 F (36.7 C)   Resp 16   Ht 5' 4 (1.626 m)   Wt 216 lb 12.8 oz (98.3 kg)   BMI 37.21 kg/m    Physical Exam Vitals and nursing note reviewed.  Constitutional:      Appearance: She is well-developed.  HENT:     Head: Normocephalic and atraumatic.  Eyes:     Conjunctiva/sclera: Conjunctivae normal.  Cardiovascular:     Rate and Rhythm: Normal rate and regular rhythm.     Heart sounds: Normal heart sounds.  Pulmonary:     Effort: Pulmonary effort is  normal.     Breath sounds: Normal breath sounds.  Abdominal:     General: Bowel sounds are normal.     Palpations: Abdomen is soft.  Musculoskeletal:     Cervical back: Normal range of motion.  Lymphadenopathy:     Cervical: No cervical adenopathy.  Skin:    General: Skin is warm and dry.     Capillary Refill: Capillary refill takes less than 2 seconds.     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  Psychiatric:        Behavior: Behavior normal.     Musculoskeletal Exam:  C-spine, thoracic spine, lumbar spine have good range of motion.  No midline spinal tenderness.  No SI joint tenderness.  Shoulder joints, elbow joints, wrist joints, MCPs, PIPs, DIPs have good range of motion with no synovitis.  Complete fist formation bilaterally.  Hip joints have good range of motion with no groin pain.  Discomfort with range of motion of both knee joints.  Ankle joints have good range of motion no tenderness or joint swelling.  No evidence of Achilles tendinitis or plantar fasciitis.   CDAI Exam: CDAI Score: -- Patient Global: --; Provider Global: -- Swollen: --; Tender: -- Joint Exam 12/29/2023   No joint exam has been documented for this visit   There is currently no information documented on the homunculus. Go to the Rheumatology activity and complete the homunculus joint exam.  Investigation: No additional findings.  Imaging: No results found.  Recent Labs: Lab Results  Component Value Date   WBC 8.4 09/08/2023   HGB 13.5 09/08/2023   PLT 358 09/08/2023   NA 140 09/08/2023   K 4.5 09/08/2023   CL 102 09/08/2023   CO2 22 09/08/2023   GLUCOSE 87 09/08/2023   BUN 13 09/08/2023   CREATININE 0.81 09/08/2023   BILITOT 0.6 09/08/2023   ALKPHOS 48 09/08/2023   AST 23 09/08/2023   ALT 28 09/08/2023   PROT 7.2 09/08/2023   ALBUMIN 4.4 09/08/2023   CALCIUM 9.7  09/08/2023   GFRAA >60 12/06/2018    Speciality Comments: PLQ Eye Exam: 12/29/2023 WNL@ [facility] Follow up in 1 year   Procedures:  No procedures performed Allergies: Patient has no known allergies.    Assessment / Plan:     Visit Diagnoses: MCTD (mixed connective tissue disease) - Positive ANA, positive RNP, fatigue, dry mouth, arthralgias, rashes, photosensitivity, hair loss, Raynaud's, livedo reticularis, lymphadenopathy per patient: Patient started to have symptoms of a flare in mid September 2025.  Her symptoms started with increased fatigue, generalized arthralgias, and intermittent rashes.  She was evaluated by her PCP who has reinitiated the patient on prednisone  5 mg daily in early October.  Patient attributes the recent flare to being on a reduced dose of Plaquenil  200 mg 1 tablet daily instead of twice daily Monday through Friday.  She had noticed clinical benefit after increasing the dose of Plaquenil  to twice daily Monday through Friday.  A new prescription for Plaquenil  will be sent to the pharmacy reflecting the appropriate dose.  She has noticed an improvement in her energy level and generalized pain while taking prednisone .  She will be following a prednisone  taper as prescribed by her PCP. She will be establishing care with dermatology on 02/07/2024.  Plan to place a new referral to [facility] to establish care to rule out interstitial lung disease. Plan to obtain the following lab work today for further evaluation.  She will remain on Plaquenil  200 mg 1 tablet by mouth twice daily Monday to Friday.  A refill be sent to the pharmacy today.  She was advised to notify us  if she develops any new or worsening symptoms.  She will follow-up in the office in 3 months or sooner if needed. . - Plan: CBC with Differential/Platelet, Comprehensive metabolic panel with GFR, RNP Antibody, C3 and C4, Sedimentation rate, Anti-DNA antibody, double-stranded, Urinalysis,  Routine w reflex microscopic  High risk medication use -Plaquenil  200 mg 1 tablet by mouth twice daily Monday through Friday. PLQ Eye Exam: 12/10/2022 (visual field), 09/02/2022 (OCT) both WNL, 08/12/2022 (comprehensive  eye exam) @ [facility] Follow up in 1 year.  Patient had an updated Plaquenil  examination today so we will call to obtain these records. CBC and CMP updated on 09/08/23.  Orders for CBC and CMP released today.   Plan: CBC with Differential/Platelet, Comprehensive metabolic panel with GFR  Chronic pain of both shoulders - X-rays April 2024 were unremarkable.  Primary osteoarthritis of both hands: No synovitis noted.  Chronic pain of both knees - X-rays of bilateral knee joints were unremarkable.  Patient continues to have chronic pain in both knee joints.  No warmth or effusion noted today.  Pain in both feet - Bilateral pes cavus noted.  X-rays were unremarkable except for inferior and posterior calcaneal spurs.  Good ROM of both ankle joints with no tenderness or joint swelling.   Rash: She continues to experience intermittent rashes especially on bilateral forearms.  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 She will be establishing care with Dr. Alm on 02/07/2024.  Discussed the importance of avoiding direct sun exposure.   Chronic midline low back pain without sciatica - MRI 2000 time was unremarkable.  X-rays were unremarkable.  Shortness of breath - Pulmonary referral placed 05/28/2023.  She has not yet scheduled a visit at [facility] pulmonary.  A new referral will be placed.  Discussed the concern for developing interstitial lung disease in patients with mixed connective tissue disease.  Chronic pain syndrome - She is on Cymbalta , tizanidine  and tramadol  prescribed by Dr. Alvia.  Other fatigue: She has noticed an improvement in her energy level since initiating prednisone .  Plan to increase the dose of Plaquenil  back to twice daily Monday  through Friday. Plan to check the following lab work today.  Other medical conditions are listed as follows:  Anxiety and depression  Smoker - 1 pack/day for 25 years.  She is vaping now.  Abnormal uterine bleeding (AUB) - Improved on progesterone per patient.  Family history of rheumatoid arthritis-mother - According to the patient her mother is on narcotics and prednisone .  Orders: Orders Placed This Encounter  Procedures   CBC with Differential/Platelet   Comprehensive metabolic panel with GFR   RNP Antibody   C3 and C4   Sedimentation rate   Anti-DNA antibody, double-stranded   Urinalysis, Routine w reflex microscopic   No orders of the defined types were placed in this encounter.   Follow-Up Instructions: Return in about 3 months (around 03/30/2024) for MCTD .   Waddell CHRISTELLA Craze, PA-C  Note - This record has been created using Dragon software.  Chart creation errors have been sought, but may not always  have been located. Such creation errors do not reflect on  the standard of medical care.

## 2023-12-23 ENCOUNTER — Other Ambulatory Visit: Payer: Self-pay | Admitting: Family Medicine

## 2023-12-23 DIAGNOSIS — G8929 Other chronic pain: Secondary | ICD-10-CM

## 2023-12-24 NOTE — Telephone Encounter (Signed)
Please see the MyChart message reply(ies) for my assessment and plan.    This patient gave consent for this Medical Advice Message and is aware that it may result in a bill to their insurance company, as well as the possibility of receiving a bill for a co-payment or deductible. They are an established patient, but are not seeking medical advice exclusively about a problem treated during an in person or video visit in the last seven days. I did not recommend an in person or video visit within seven days of my reply.    I spent a total of 8 minutes cumulative time within 7 days through MyChart messaging.  Cody Matthews, DO   

## 2023-12-27 ENCOUNTER — Other Ambulatory Visit: Payer: Self-pay

## 2023-12-27 ENCOUNTER — Encounter: Payer: Self-pay | Admitting: Family Medicine

## 2023-12-27 DIAGNOSIS — G8929 Other chronic pain: Secondary | ICD-10-CM

## 2023-12-27 MED ORDER — TIZANIDINE HCL 4 MG PO TABS: 4.0000 mg | ORAL_TABLET | Freq: Four times a day (QID) | ORAL | 1 refills | Status: DC | PRN

## 2023-12-29 ENCOUNTER — Other Ambulatory Visit: Payer: Self-pay

## 2023-12-29 ENCOUNTER — Ambulatory Visit: Attending: Physician Assistant | Admitting: Physician Assistant

## 2023-12-29 ENCOUNTER — Encounter: Payer: Self-pay | Admitting: Physician Assistant

## 2023-12-29 VITALS — BP 124/76 | HR 123 | Temp 98.0°F | Resp 16 | Ht 64.0 in | Wt 216.8 lb

## 2023-12-29 DIAGNOSIS — R0602 Shortness of breath: Secondary | ICD-10-CM

## 2023-12-29 DIAGNOSIS — M19041 Primary osteoarthritis, right hand: Secondary | ICD-10-CM | POA: Diagnosis not present

## 2023-12-29 DIAGNOSIS — F419 Anxiety disorder, unspecified: Secondary | ICD-10-CM

## 2023-12-29 DIAGNOSIS — R21 Rash and other nonspecific skin eruption: Secondary | ICD-10-CM

## 2023-12-29 DIAGNOSIS — Z8261 Family history of arthritis: Secondary | ICD-10-CM

## 2023-12-29 DIAGNOSIS — M79671 Pain in right foot: Secondary | ICD-10-CM

## 2023-12-29 DIAGNOSIS — M25511 Pain in right shoulder: Secondary | ICD-10-CM | POA: Diagnosis not present

## 2023-12-29 DIAGNOSIS — M351 Other overlap syndromes: Secondary | ICD-10-CM

## 2023-12-29 DIAGNOSIS — G8929 Other chronic pain: Secondary | ICD-10-CM

## 2023-12-29 DIAGNOSIS — F17200 Nicotine dependence, unspecified, uncomplicated: Secondary | ICD-10-CM

## 2023-12-29 DIAGNOSIS — N939 Abnormal uterine and vaginal bleeding, unspecified: Secondary | ICD-10-CM

## 2023-12-29 DIAGNOSIS — F32A Depression, unspecified: Secondary | ICD-10-CM

## 2023-12-29 DIAGNOSIS — Z79899 Other long term (current) drug therapy: Secondary | ICD-10-CM | POA: Diagnosis not present

## 2023-12-29 DIAGNOSIS — M19042 Primary osteoarthritis, left hand: Secondary | ICD-10-CM

## 2023-12-29 DIAGNOSIS — M25562 Pain in left knee: Secondary | ICD-10-CM

## 2023-12-29 DIAGNOSIS — M25561 Pain in right knee: Secondary | ICD-10-CM

## 2023-12-29 DIAGNOSIS — R5383 Other fatigue: Secondary | ICD-10-CM

## 2023-12-29 DIAGNOSIS — M79672 Pain in left foot: Secondary | ICD-10-CM

## 2023-12-29 DIAGNOSIS — H5711 Ocular pain, right eye: Secondary | ICD-10-CM

## 2023-12-29 DIAGNOSIS — M545 Low back pain, unspecified: Secondary | ICD-10-CM

## 2023-12-29 DIAGNOSIS — M25512 Pain in left shoulder: Secondary | ICD-10-CM

## 2023-12-29 DIAGNOSIS — G894 Chronic pain syndrome: Secondary | ICD-10-CM

## 2023-12-29 MED ORDER — HYDROXYCHLOROQUINE SULFATE 200 MG PO TABS: ORAL_TABLET | ORAL | 0 refills | Status: DC

## 2023-12-29 NOTE — Progress Notes (Unsigned)
 Patient seen in office today. Per Waddell Craze, send new refill of PLQ reflecting 2 tablets by mouth daily M-F and place a new referral for pulmonary. Please review and sign.

## 2023-12-30 LAB — CBC WITH DIFFERENTIAL/PLATELET
Absolute Lymphocytes: 3040 {cells}/uL (ref 850–3900)
Absolute Monocytes: 466 {cells}/uL (ref 200–950)
Basophils Absolute: 29 {cells}/uL (ref 0–200)
Basophils Relative: 0.3 %
Eosinophils Absolute: 105 {cells}/uL (ref 15–500)
Eosinophils Relative: 1.1 %
HCT: 36.4 % (ref 35.0–45.0)
Hemoglobin: 12.2 g/dL (ref 11.7–15.5)
MCH: 33.4 pg — ABNORMAL HIGH (ref 27.0–33.0)
MCHC: 33.5 g/dL (ref 32.0–36.0)
MCV: 99.7 fL (ref 80.0–100.0)
MPV: 8.9 fL (ref 7.5–12.5)
Monocytes Relative: 4.9 %
Neutro Abs: 5862 {cells}/uL (ref 1500–7800)
Neutrophils Relative %: 61.7 %
Platelets: 309 Thousand/uL (ref 140–400)
RBC: 3.65 Million/uL — ABNORMAL LOW (ref 3.80–5.10)
RDW: 11.6 % (ref 11.0–15.0)
Total Lymphocyte: 32 %
WBC: 9.5 Thousand/uL (ref 3.8–10.8)

## 2023-12-30 LAB — COMPREHENSIVE METABOLIC PANEL WITH GFR
AG Ratio: 1.6 (calc) (ref 1.0–2.5)
ALT: 18 U/L (ref 6–29)
AST: 12 U/L (ref 10–35)
Albumin: 4.3 g/dL (ref 3.6–5.1)
Alkaline phosphatase (APISO): 25 U/L — ABNORMAL LOW (ref 31–125)
BUN: 17 mg/dL (ref 7–25)
CO2: 27 mmol/L (ref 20–32)
Calcium: 9.7 mg/dL (ref 8.6–10.2)
Chloride: 106 mmol/L (ref 98–110)
Creat: 0.84 mg/dL (ref 0.50–0.99)
Globulin: 2.7 g/dL (ref 1.9–3.7)
Glucose, Bld: 101 mg/dL — ABNORMAL HIGH (ref 65–99)
Potassium: 4.6 mmol/L (ref 3.5–5.3)
Sodium: 141 mmol/L (ref 135–146)
Total Bilirubin: 0.8 mg/dL (ref 0.2–1.2)
Total Protein: 7 g/dL (ref 6.1–8.1)
eGFR: 86 mL/min/1.73m2 (ref >=60)

## 2023-12-30 LAB — URINALYSIS, ROUTINE W REFLEX MICROSCOPIC
Bilirubin Urine: NEGATIVE
Glucose, UA: NEGATIVE
Hyaline Cast: NONE SEEN /LPF
Nitrite: NEGATIVE
Specific Gravity, Urine: 1.026 (ref 1.001–1.035)
Squamous Epithelial / HPF: NONE SEEN /HPF (ref <=5)
pH: 5.5 (ref 5.0–8.0)

## 2023-12-30 LAB — RNP ANTIBODY: Ribonucleic Protein(ENA) Antibody, IgG: 4.6 AI — AB

## 2023-12-30 LAB — ANTI-DNA ANTIBODY, DOUBLE-STRANDED: ds DNA Ab: <1 [IU]/mL

## 2023-12-30 LAB — C3 AND C4
C3 Complement: 147 mg/dL (ref 83–193)
C4 Complement: 18 mg/dL (ref 15–57)

## 2023-12-30 LAB — SEDIMENTATION RATE: Sed Rate: 11 mm/h (ref 0–20)

## 2023-12-31 ENCOUNTER — Ambulatory Visit: Payer: Self-pay | Admitting: Physician Assistant

## 2023-12-31 DIAGNOSIS — Z79899 Other long term (current) drug therapy: Secondary | ICD-10-CM

## 2023-12-31 NOTE — Progress Notes (Signed)
 CBC stable.  Alk phos is low, rest of CMP WNL.  Recommend rechecking in 1 month.  ESR WNL RNP remains positive.   Complements WNL and dsDNA negative-good news!

## 2024-01-19 LAB — HM PAP SMEAR: HPV, high-risk: NEGATIVE

## 2024-02-06 ENCOUNTER — Other Ambulatory Visit: Payer: Self-pay | Admitting: Family Medicine

## 2024-02-07 ENCOUNTER — Ambulatory Visit: Admitting: Dermatology

## 2024-02-24 ENCOUNTER — Encounter: Payer: Self-pay | Admitting: Family Medicine

## 2024-02-24 MED ORDER — PREDNISONE 10 MG (48) PO TBPK: ORAL_TABLET | ORAL | 0 refills | Status: DC

## 2024-03-02 ENCOUNTER — Ambulatory Visit: Payer: Self-pay

## 2024-03-02 NOTE — Telephone Encounter (Signed)
 FYI Only or Action Required?: FYI only for provider: appointment scheduled on 12.19.25.  Patient was last seen in primary care on 11/10/2023 by Alvia Bring, DO.  Called Nurse Triage reporting Pain.  Symptoms began several weeks ago.  Interventions attempted: OTC medications: tylenol and Prescription medications: tramadol .  Symptoms are: gradually worsening.  Triage Disposition: See HCP Within 4 Hours (Or PCP Triage)  Patient/caregiver understands and will follow disposition?: Yes   Copied from CRM #[id number]. Topic: Clinical - Red Word Triage >> Mar 02, 2024 10:10 AM Montie POUR wrote: Red Word that prompted transfer to Nurse Triage:  Brain fog; hurting all over body, feels like pins going into her; can't sleep. Going on for last 2 weeks. Pain level is from 7 to 10.It is getting worse. She wants to schedule an appointment.  She wrote Dr. Alvia a message through MyChart and he wants her to come in for an appointments. Reason for Disposition  [1] SEVERE pain (e.g., excruciating, unable to do any normal activities) AND [2] not improved 2 hours after pain medicine  Answer Assessment - Initial Assessment Questions Pt has mixed connective tissue disorder. Currently in a flare.  Achy on the top of her back across shoulders, even her clothes are hurting. Not sleeping, she has added 20mg  of melatonin at night and is only sleeping 3 hours. She gets a sharp pain in her right foot across all toes. Feels like her legs are swollen and tight but it is not noticeable to the eye Rn offered pt appt today, requested tomorrow for work purposes   1. ONSET: When did the muscle aches or body pains start?      Ongoing  2. LOCATION: What part of your body is hurting? (e.g., entire body, arms, legs)      Generalized but upper back is worse currently 3. SEVERITY: How bad is the pain? (Scale 1-10; or mild, moderate, severe)     7-10 4. CAUSE: What do you think is causing the pains?     Mixed  connective tissue disorder 5 OTHER SYMPTOMS: Do you have any other symptoms? (e.g., chest pain, cold or flu symptoms, rash, weakness, weight loss)     denies  Protocols used: Muscle Aches and Body Pain-A-AH

## 2024-03-02 NOTE — Telephone Encounter (Signed)
 Patient scheduled to see Dr. Alvia tomorrow in clinic

## 2024-03-03 ENCOUNTER — Ambulatory Visit: Admitting: Family Medicine

## 2024-03-03 VITALS — BP 146/77 | HR 69 | Temp 97.9°F | Resp 18 | Ht 66.0 in | Wt 222.1 lb

## 2024-03-03 DIAGNOSIS — R52 Pain, unspecified: Secondary | ICD-10-CM

## 2024-03-03 MED ORDER — PREGABALIN 75 MG PO CAPS: 75.0000 mg | ORAL_CAPSULE | Freq: Two times a day (BID) | ORAL | 1 refills | Status: AC

## 2024-03-03 MED ORDER — DULOXETINE HCL 60 MG PO CPEP: 60.0000 mg | ORAL_CAPSULE | Freq: Every day | ORAL | 1 refills | Status: DC

## 2024-03-03 MED ORDER — TRAMADOL HCL 50 MG PO TABS: 50.0000 mg | ORAL_TABLET | Freq: Three times a day (TID) | ORAL | 1 refills | Status: AC | PRN

## 2024-03-03 NOTE — Progress Notes (Unsigned)
 " Michele Herrera - 48 y.o. female MRN [id number]  Date of birth: 10/26/1975  Subjective Chief Complaint  Patient presents with   Acute Visit    HPI Michele Herrera is a 48 y.o. female here today for follow up.   History of positive ANA, Dx with mixed connective tissue disease.  Current management with plaquenil , duloxetine  and tramadol  as needed.  Has has flare of pain.  Prednisone  added but really hasn't helped a whole lot.  Describes hyperalgesia even with light touch.  Denies new joint swelling.  Has had some fatigue. She has been out of work from 02/21/24 to 02/29/24 due to pain and foggy thinking   ROS:  A comprehensive ROS was completed and negative except as noted per HPI  Allergies[1]  Past Medical History:  Diagnosis Date   Anxiety 2000   I do not know an exact date   Arthritis 2014   This is when i was diagnosed   Bronchitis    Colon cancer screening 07/10/2021   GERD (gastroesophageal reflux disease) 1993   Lupus (systemic lupus erythematosus) (HCC)    Pneumonia    Seasonal allergies     Past Surgical History:  Procedure Laterality Date   TEMPOROMANDIBULAR JOINT SURGERY     TMJ surgeries     TONSILLECTOMY     TUBAL LIGATION  06/1997    Social History   Socioeconomic History   Marital status: Divorced    Spouse name: Not on file   Number of children: 2   Years of education: Not on file   Highest education level: Associate degree: occupational, scientist, product/process development, or vocational program  Occupational History   Not on file  Tobacco Use   Smoking status: Former    Current packs/day: 0.00    Types: Cigarettes    Quit date: 03/2022    Years since quitting: 1.9    Passive exposure: Past   Smokeless tobacco: Never  Vaping Use   Vaping status: Every Day  Substance and Sexual Activity   Alcohol use: Yes    Comment: ocassional   Drug use: No   Sexual activity: Yes    Birth control/protection: None  Other Topics Concern   Not on file   Social History Narrative   Not on file   Social Drivers of Health   Tobacco Use: Medium Risk (03/03/2024)   Patient History    Smoking Tobacco Use: Former    Smokeless Tobacco Use: Never    Passive Exposure: Past  Physicist, Medical Strain: High Risk (11/23/2023)   Received from [facility]   Overall Financial Resource Strain (CARDIA)    How hard is it for you to pay for the very basics like food, housing, medical care, and heating?: Very hard  Food Insecurity: Food Insecurity Present (11/23/2023)   Received from [facility]   Epic    Within the past 12 months, you worried that your food would run out before you got the money to buy more.: Often true    Within the past 12 months, the food you bought just didn't last and you didn't have money to get more.: Sometimes true  Transportation Needs: Unmet Transportation Needs (11/23/2023)   Received from [facility]    In the past 12 months, has lack of transportation kept you from medical appointments or from getting medications?: Yes    In the past 12 months, has lack of transportation kept you from meetings, work, or from getting things needed for  daily living?: No  Physical Activity: Inactive (11/23/2023)   Received from [facility]   Exercise Vital Sign    On average, how many days per week do you engage in moderate to strenuous exercise (like a brisk walk)?: 0 days    Minutes of Exercise per Session: Not on file  Stress: Stress Concern Present (11/23/2023)   Received from [facility] - Occupational Stress Questionnaire    Do you feel stress - tense, restless, nervous, or anxious, or unable to sleep at night because your mind is troubled all the time - these days?: To some extent  Social Connections: Socially Isolated (11/23/2023)   Received from [facility]   Social Network    How would you rate your social network (family, work, friends)?: Little participation,  lonely and socially isolated  Depression (PHQ2-9): High Risk (09/08/2023)   Depression (PHQ2-9)    PHQ-2 Score: 15  Alcohol Screen: Low Risk (09/08/2023)   Alcohol Screen    Last Alcohol Screening Score (AUDIT): 1  Housing: High Risk (11/23/2023)   Received from [facility]    In the last 12 months, was there a time when you were not able to pay the mortgage or rent on time?: Yes    In the past 12 months, how many times have you moved where you were living?: 0    At any time in the past 12 months, were you homeless or living in a shelter (including now)?: No  Utilities: Not At Risk (11/23/2023)   Received from [facility]    In the past 12 months has the electric, gas, oil, or water company threatened to shut off services in your home?: No  Recent Concern: Utilities - At Risk (09/08/2023)   Epic    Threatened with loss of utilities: Yes  Health Literacy: Adequate Health Literacy (09/08/2023)   B1300 Health Literacy    Frequency of need for help with medical instructions: Never    Family History  Problem Relation Age of Onset   Cancer Mother    Hypertension Mother    Arthritis Mother    COPD Mother    Early death Mother    Hypertension Father    Cancer Maternal Grandmother    Cancer Maternal Grandfather    Hypertension Maternal Grandfather    Cancer Paternal Grandmother    Hypertension Paternal Grandmother    Heart disease Paternal Grandmother    Hearing loss Paternal Grandmother    Cancer Paternal Grandfather    Coronary artery disease Other    Cancer Other    Arthritis Other    ADD / ADHD Daughter     Health Maintenance  Topic Date Due   HIV Screening  Never done   Hepatitis C Screening  Never done   Cervical Cancer Screening (HPV/Pap Cotest)  Never done   Mammogram  01/14/2023   COVID-19 Vaccine (1) 03/19/2024 (Originally 11/20/1975)   Influenza Vaccine  06/13/2024 (Originally 10/15/2023)   Hepatitis B Vaccines 19-59  Average Risk (1 of 3 - 19+ 3-dose series) 03/03/2025 (Originally 05/20/1994)   Colonoscopy  10/09/2031   DTaP/Tdap/Td (2 - Td or Tdap) 09/07/2033   Pneumococcal Vaccine  Aged Out   HPV VACCINES  Aged Out   Meningococcal B Vaccine  Aged Out     ----------------------------------------------------------------------------------------------------------------------------------------------------------------------------------------------------------------- Physical Exam BP (!) 146/77 (BP Location: Left Arm, Patient Position: Sitting, Cuff Size: Normal)   Pulse 69   Temp 97.9  F (36.6 C) (Oral)   Resp 18   Ht 5' 6 (1.676 m)   Wt 222 lb 1.3 oz (100.7 kg)   SpO2 100%   BMI 35.84 kg/m   Physical Exam Constitutional:      Appearance: Normal appearance.  Cardiovascular:     Rate and Rhythm: Normal rate and regular rhythm.  Musculoskeletal:     Comments: TTP to pretty much anywhere palpated.  No joint swelling or effusion noted.   Neurological:     Mental Status: She is alert.     ------------------------------------------------------------------------------------------------------------------------------------------------------------------------------------------------------------------- Assessment and Plan  No problem-specific Assessment & Plan notes found for this encounter.   Meds ordered this encounter  Medications   pregabalin  (LYRICA ) 75 MG capsule    Sig: Take 1 capsule (75 mg total) by mouth 2 (two) times daily.    Dispense:  60 capsule    Refill:  1   DULoxetine  (CYMBALTA ) 60 MG capsule    Sig: Take 1 capsule (60 mg total) by mouth daily.    Dispense:  90 capsule    Refill:  1   traMADol  (ULTRAM ) 50 MG tablet    Sig: Take 1 tablet (50 mg total) by mouth every 8 (eight) hours as needed.    Dispense:  60 tablet    Refill:  1    Not to exceed 4 additional fills before 03/23/2023    Return in about 2 weeks (around 03/17/2024) for F/u pain.           [1] No Known Allergies "

## 2024-03-03 NOTE — Patient Instructions (Signed)
 Increase duloxetine  to 60mg  daily Start lyrica (pregabalin ) 75mg  twice per day.  See me again in 2-3 weeks.

## 2024-03-05 ENCOUNTER — Other Ambulatory Visit: Payer: Self-pay | Admitting: Family Medicine

## 2024-03-05 NOTE — Assessment & Plan Note (Signed)
 History of positive ANA, diagnosed with mixed connective tissue disease.  Recent course of steroids was not very helpful.  I think she may have fibromyalgia overlap.  Increase duloxetine  60 mg and add Lyrica  75 mg twice per day.  Follow-up in about 2 weeks.

## 2024-03-07 ENCOUNTER — Other Ambulatory Visit (HOSPITAL_COMMUNITY): Payer: Self-pay

## 2024-03-07 ENCOUNTER — Telehealth: Payer: Self-pay

## 2024-03-07 NOTE — Telephone Encounter (Signed)
 Pharmacy Patient Advocate Encounter   Received notification from Patient Advice Request messages that prior authorization for Pregabalin  75mg  caps is required/requested.   Insurance verification completed.   The patient is insured through [facility].   Per test claim: PA required; PA submitted to above mentioned insurance via Latent Key/confirmation #/EOC [id number] Status is pending

## 2024-03-07 NOTE — Telephone Encounter (Signed)
 Pharmacy Patient Advocate Encounter  Received notification from [facility] that Prior Authorization for Pregabalin  75mg  caps has been APPROVED from 03/07/24 to 03/07/25   PA #/Case ID/Reference #: [id number]

## 2024-03-13 ENCOUNTER — Encounter: Payer: Self-pay | Admitting: Family Medicine

## 2024-03-17 ENCOUNTER — Other Ambulatory Visit: Payer: Self-pay | Admitting: Medical Genetics

## 2024-03-17 ENCOUNTER — Encounter: Payer: Self-pay | Admitting: Family Medicine

## 2024-03-17 ENCOUNTER — Ambulatory Visit (INDEPENDENT_AMBULATORY_CARE_PROVIDER_SITE_OTHER): Admitting: Family Medicine

## 2024-03-17 VITALS — BP 138/85 | HR 74 | Ht 66.0 in | Wt 224.0 lb

## 2024-03-17 DIAGNOSIS — R051 Acute cough: Secondary | ICD-10-CM

## 2024-03-17 DIAGNOSIS — R52 Pain, unspecified: Secondary | ICD-10-CM

## 2024-03-17 DIAGNOSIS — R059 Cough, unspecified: Secondary | ICD-10-CM | POA: Insufficient documentation

## 2024-03-17 MED ORDER — AZITHROMYCIN 250 MG PO TABS: ORAL_TABLET | ORAL | 0 refills | Status: AC

## 2024-03-17 MED ORDER — DULOXETINE HCL 60 MG PO CPEP: 60.0000 mg | ORAL_CAPSULE | Freq: Two times a day (BID) | ORAL | 1 refills | Status: AC

## 2024-03-17 MED ORDER — PREDNISONE 20 MG PO TABS: 20.0000 mg | ORAL_TABLET | Freq: Two times a day (BID) | ORAL | 0 refills | Status: AC

## 2024-03-17 NOTE — Patient Instructions (Signed)
 Increase duloxetine  to 60mg  twice per day.  Continue lyrica  at current strength.   For cough start azithromycin  and prednisone .  Let me know if not improving.

## 2024-03-17 NOTE — Assessment & Plan Note (Signed)
 History of positive ANA, diagnosed with mixed connective tissue disease.  Recent course of steroids was not very helpful.  I think she may have fibromyalgia overlap.  Increase duloxetine   further to 60mg  BID and continue lyrica  at current strength.

## 2024-03-17 NOTE — Assessment & Plan Note (Signed)
 Adding course of azithromycin  and prednisone .  Red flags reviewed.  Contact clinic if not improving.

## 2024-03-17 NOTE — Progress Notes (Signed)
 " REIZEL CALZADA - 49 y.o. female MRN [id number]  Date of birth: 1975/12/15  Subjective Chief Complaint  Patient presents with   Cough   Concentration Issue   Spasms    HPI DEMESHA BOORMAN is a 49 y.o. female here today for follow up visit.   History of mixed connective tissue disease, followed by rheumatology.  Also seems to have fibromyalgia overlap.  She as having some hyperalgesia along with tingling sensation at previous visit.  Lyrica  was added and she reports that this seems to be helping.  Unable to tolerate BID at this time due to drowsiness.  She does continue to on cymbalta .   She has had cough with some dyspnea.  Cough started a little over a week ago and has become more productive of thickened sputum.   Pain the RLL area, worse with coughing.  Denies fever or chills.  Hasn't really tried anything so far for this.   She is a smoker.   ROS:  A comprehensive ROS was completed and negative except as noted per HPI  Allergies[1]  Past Medical History:  Diagnosis Date   Anxiety 2000   I do not know an exact date   Arthritis 2014   This is when i was diagnosed   Bronchitis    Colon cancer screening 07/10/2021   GERD (gastroesophageal reflux disease) 1993   Lupus (systemic lupus erythematosus) (HCC)    Pneumonia    Seasonal allergies     Past Surgical History:  Procedure Laterality Date   TEMPOROMANDIBULAR JOINT SURGERY     TMJ surgeries     TONSILLECTOMY     TUBAL LIGATION  06/1997    Social History   Socioeconomic History   Marital status: Divorced    Spouse name: Not on file   Number of children: 2   Years of education: Not on file   Highest education level: Associate degree: occupational, scientist, product/process development, or vocational program  Occupational History   Not on file  Tobacco Use   Smoking status: Former    Current packs/day: 0.00    Types: Cigarettes    Quit date: 03/2022    Years since quitting: 2.0    Passive exposure: Past   Smokeless tobacco: Never   Vaping Use   Vaping status: Every Day  Substance and Sexual Activity   Alcohol use: Yes    Comment: ocassional   Drug use: No   Sexual activity: Yes    Birth control/protection: None  Other Topics Concern   Not on file  Social History Narrative   Not on file   Social Drivers of Health   Tobacco Use: Medium Risk (03/17/2024)   Patient History    Smoking Tobacco Use: Former    Smokeless Tobacco Use: Never    Passive Exposure: Past  Physicist, Medical Strain: High Risk (11/23/2023)   Received from [facility]   Overall Financial Resource Strain (CARDIA)    How hard is it for you to pay for the very basics like food, housing, medical care, and heating?: Very hard  Food Insecurity: Food Insecurity Present (11/23/2023)   Received from [facility]   Epic    Within the past 12 months, you worried that your food would run out before you got the money to buy more.: Often true    Within the past 12 months, the food you bought just didn't last and you didn't have money to get more.: Sometimes true  Transportation Needs: Unmet Transportation Needs (11/23/2023)  Received from [facility]    In the past 12 months, has lack of transportation kept you from medical appointments or from getting medications?: Yes    In the past 12 months, has lack of transportation kept you from meetings, work, or from getting things needed for daily living?: No  Physical Activity: Inactive (11/23/2023)   Received from [facility]   Exercise Vital Sign    On average, how many days per week do you engage in moderate to strenuous exercise (like a brisk walk)?: 0 days    Minutes of Exercise per Session: Not on file  Stress: Stress Concern Present (11/23/2023)   Received from [facility] of Occupational Health - Occupational Stress Questionnaire    Do you feel stress - tense, restless, nervous, or anxious, or unable to sleep at night because your mind is troubled all the time - these  days?: To some extent  Social Connections: Socially Isolated (11/23/2023)   Received from [facility]   Social Network    How would you rate your social network (family, work, friends)?: Little participation, lonely and socially isolated  Depression (PHQ2-9): High Risk (03/17/2024)   Depression (PHQ2-9)    PHQ-2 Score: 22  Alcohol Screen: Low Risk (09/08/2023)   Alcohol Screen    Last Alcohol Screening Score (AUDIT): 1  Housing: High Risk (11/23/2023)   Received from [facility]    In the last 12 months, was there a time when you were not able to pay the mortgage or rent on time?: Yes    In the past 12 months, how many times have you moved where you were living?: 0    At any time in the past 12 months, were you homeless or living in a shelter (including now)?: No  Utilities: Not At Risk (11/23/2023)   Received from [facility]    In the past 12 months has the electric, gas, oil, or water company threatened to shut off services in your home?: No  Recent Concern: Utilities - At Risk (09/08/2023)   Epic    Threatened with loss of utilities: Yes  Health Literacy: Adequate Health Literacy (09/08/2023)   B1300 Health Literacy    Frequency of need for help with medical instructions: Never    Family History  Problem Relation Age of Onset   Cancer Mother    Hypertension Mother    Arthritis Mother    COPD Mother    Early death Mother    Hypertension Father    Cancer Maternal Grandmother    Cancer Maternal Grandfather    Hypertension Maternal Grandfather    Cancer Paternal Grandmother    Hypertension Paternal Grandmother    Heart disease Paternal Grandmother    Hearing loss Paternal Grandmother    Cancer Paternal Grandfather    Coronary artery disease Other    Cancer Other    Arthritis Other    ADD / ADHD Daughter     Health Maintenance  Topic Date Due   HIV Screening  Never done   Hepatitis C Screening  Never done   COVID-19 Vaccine (1) 03/19/2024 (Originally  11/20/1975)   Influenza Vaccine  06/13/2024 (Originally 10/15/2023)   Hepatitis B Vaccines 19-59 Average Risk (1 of 3 - 19+ 3-dose series) 03/03/2025 (Originally 05/20/1994)   Mammogram  05/25/2024   Cervical Cancer Screening (HPV/Pap Cotest)  01/18/2029   Colonoscopy  10/09/2031   DTaP/Tdap/Td (2 - Td or Tdap)  09/07/2033   Pneumococcal Vaccine  Aged Out   HPV VACCINES  Aged Out   Meningococcal B Vaccine  Aged Out     ----------------------------------------------------------------------------------------------------------------------------------------------------------------------------------------------------------------- Physical Exam BP 138/85 (BP Location: Left Arm, Patient Position: Sitting)   Pulse 74   Ht 5' 6 (1.676 m)   Wt 224 lb (101.6 kg)   SpO2 100%   BMI 36.15 kg/m   Physical Exam Constitutional:      Appearance: Normal appearance.  HENT:     Head: Normocephalic and atraumatic.  Eyes:     General: No scleral icterus. Cardiovascular:     Rate and Rhythm: Normal rate and regular rhythm.  Pulmonary:     Effort: Pulmonary effort is normal.     Breath sounds: Normal breath sounds.  Neurological:     Mental Status: She is alert.  Psychiatric:        Mood and Affect: Mood normal.        Behavior: Behavior normal.     ------------------------------------------------------------------------------------------------------------------------------------------------------------------------------------------------------------------- Assessment and Plan  Cough Adding course of azithromycin  and prednisone .  Red flags reviewed.  Contact clinic if not improving.   Diffuse pain History of positive ANA, diagnosed with mixed connective tissue disease.  Recent course of steroids was not very helpful.  I think she may have fibromyalgia overlap.  Increase duloxetine   further to 60mg  BID and continue lyrica  at current strength.    Meds ordered this encounter  Medications    azithromycin  (ZITHROMAX ) 250 MG tablet    Sig: Take 2 tablets on day 1, then 1 tablet daily on days 2 through 5    Dispense:  6 tablet    Refill:  0   predniSONE  (DELTASONE ) 20 MG tablet    Sig: Take 1 tablet (20 mg total) by mouth 2 (two) times daily with a meal for 5 days.    Dispense:  10 tablet    Refill:  0   DULoxetine  (CYMBALTA ) 60 MG capsule    Sig: Take 1 capsule (60 mg total) by mouth 2 (two) times daily.    Dispense:  180 capsule    Refill:  1    Return in about 6 weeks (around 04/28/2024) for Mood/BH, pain.        [1] No Known Allergies  "

## 2024-03-21 ENCOUNTER — Ambulatory Visit: Admitting: Internal Medicine

## 2024-03-22 ENCOUNTER — Encounter: Payer: Self-pay | Admitting: Family Medicine

## 2024-03-24 ENCOUNTER — Encounter: Payer: Self-pay | Admitting: Family Medicine

## 2024-03-24 DIAGNOSIS — M351 Other overlap syndromes: Secondary | ICD-10-CM | POA: Insufficient documentation

## 2024-04-05 ENCOUNTER — Encounter: Payer: Self-pay | Admitting: Pulmonary Disease

## 2024-04-05 ENCOUNTER — Ambulatory Visit: Admitting: Pulmonary Disease

## 2024-04-07 NOTE — Progress Notes (Unsigned)
 "  Office Visit Note  Patient: Michele Herrera             Date of Birth: 1975/05/23           MRN: [id number]             PCP: Alvia Bring, DO Referring: Alvia Bring, DO Visit Date: 04/21/2024 Occupation: Data Unavailable  Subjective:  No chief complaint on file.   History of Present Illness: Michele Herrera is a 49 y.o. female ***     Activities of Daily Living:  Patient reports morning stiffness for *** {minute/hour:19697}.   Patient {ACTIONS;DENIES/REPORTS:21021675::Denies} nocturnal pain.  Difficulty dressing/grooming: {ACTIONS;DENIES/REPORTS:21021675::Denies} Difficulty climbing stairs: {ACTIONS;DENIES/REPORTS:21021675::Denies} Difficulty getting out of chair: {ACTIONS;DENIES/REPORTS:21021675::Denies} Difficulty using hands for taps, buttons, cutlery, and/or writing: {ACTIONS;DENIES/REPORTS:21021675::Denies}  No Rheumatology ROS completed.   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) 07/29/2022   Elevated antinuclear antibody (ANA) level 07/17/2022   Acute pain of both shoulders 06/29/2022   Chest pain 04/08/2022   Chronic joint pain 07/10/2021   Other fatigue 07/10/2021   Colon cancer screening 07/10/2021   Nicotine dependence 07/10/2021   LUMBAR RADICULOPATHY 11/13/2008   BREAST MASS, LEFT 02/03/2008    Past Medical History:  Diagnosis Date   Anxiety 2000   I do not know an exact date   Arthritis 2014   This is when i was diagnosed   Bronchitis    Colon cancer screening 07/10/2021   GERD (gastroesophageal reflux disease) 1993    Lupus (systemic lupus erythematosus) (HCC)    Pneumonia    Seasonal allergies     Family History  Problem Relation Age of Onset   Cancer Mother    Hypertension Mother    Arthritis Mother    COPD Mother    Early death Mother    Hypertension Father    Cancer Maternal Grandmother    Cancer Maternal Grandfather    Hypertension Maternal Grandfather    Cancer Paternal Grandmother    Hypertension Paternal Grandmother    Heart disease Paternal Grandmother    Hearing loss Paternal Grandmother    Cancer Paternal Grandfather    Coronary artery disease Other    Cancer Other    Arthritis Other    ADD / ADHD Daughter    Past Surgical History:  Procedure Laterality Date   TEMPOROMANDIBULAR JOINT SURGERY     TMJ surgeries     TONSILLECTOMY     TUBAL LIGATION  06/1997   Social History[1] Social History   Social History Narrative   Not on file     Immunization History  Administered Date(s) Administered   PNEUMOCOCCAL CONJUGATE-20 09/08/2023   Tdap 09/08/2023     Objective: Vital Signs: There were no vitals taken for this visit.   Physical Exam   Musculoskeletal Exam: ***  CDAI Exam: CDAI Score: -- Patient Global: --; Provider Global: -- Swollen: --; Tender: -- Joint Exam 04/21/2024   No joint exam has been documented for this visit   There is currently no information documented on the homunculus. Go to the Rheumatology activity and complete the homunculus  joint exam.  Investigation: No additional findings.  Imaging: No results found.  Recent Labs: Lab Results  Component Value Date   WBC 9.5 12/29/2023   HGB 12.2 12/29/2023   PLT 309 12/29/2023   NA 141 12/29/2023   K 4.6 12/29/2023   CL 106 12/29/2023   CO2 27 12/29/2023   GLUCOSE 101 (H) 12/29/2023   BUN 17 12/29/2023   CREATININE 0.84 12/29/2023   BILITOT 0.8 12/29/2023   ALKPHOS 48 09/08/2023   AST 12 12/29/2023   ALT 18 12/29/2023   PROT 7.0 12/29/2023   ALBUMIN 4.4 09/08/2023   CALCIUM 9.7  12/29/2023   GFRAA >60 12/06/2018    Speciality Comments: PLQ Eye Exam: 12/29/2023 WNL@ [facility] Follow up in 1 year   Procedures:  No procedures performed Allergies: Patient has no known allergies.   Assessment / Plan:     Visit Diagnoses: No diagnosis found.  Orders: No orders of the defined types were placed in this encounter.  No orders of the defined types were placed in this encounter.   Face-to-face time spent with patient was *** minutes. Greater than 50% of time was spent in counseling and coordination of care.  Follow-Up Instructions: No follow-ups on file.   Daved JAYSON Gavel, CMA  Note - This record has been created using Animal nutritionist.  Chart creation errors have been sought, but may not always  have been located. Such creation errors do not reflect on  the standard of medical care.    [1]  Social History Tobacco Use   Smoking status: Former    Current packs/day: 0.00    Types: Cigarettes    Quit date: 03/2022    Years since quitting: 2.0    Passive exposure: Past   Smokeless tobacco: Never  Vaping Use   Vaping status: Every Day  Substance Use Topics   Alcohol use: Yes    Comment: ocassional   Drug use: No   "

## 2024-04-21 ENCOUNTER — Ambulatory Visit: Admitting: Rheumatology

## 2024-04-21 ENCOUNTER — Other Ambulatory Visit

## 2024-04-21 DIAGNOSIS — F32A Depression, unspecified: Secondary | ICD-10-CM

## 2024-04-21 DIAGNOSIS — R0602 Shortness of breath: Secondary | ICD-10-CM

## 2024-04-21 DIAGNOSIS — M351 Other overlap syndromes: Secondary | ICD-10-CM

## 2024-04-21 DIAGNOSIS — G8929 Other chronic pain: Secondary | ICD-10-CM

## 2024-04-21 DIAGNOSIS — Z79899 Other long term (current) drug therapy: Secondary | ICD-10-CM

## 2024-04-21 DIAGNOSIS — M19041 Primary osteoarthritis, right hand: Secondary | ICD-10-CM

## 2024-04-21 DIAGNOSIS — R21 Rash and other nonspecific skin eruption: Secondary | ICD-10-CM

## 2024-04-21 DIAGNOSIS — R5383 Other fatigue: Secondary | ICD-10-CM

## 2024-04-21 DIAGNOSIS — M79671 Pain in right foot: Secondary | ICD-10-CM

## 2024-04-21 DIAGNOSIS — N939 Abnormal uterine and vaginal bleeding, unspecified: Secondary | ICD-10-CM

## 2024-04-21 DIAGNOSIS — G894 Chronic pain syndrome: Secondary | ICD-10-CM

## 2024-04-21 DIAGNOSIS — F17200 Nicotine dependence, unspecified, uncomplicated: Secondary | ICD-10-CM

## 2024-04-21 DIAGNOSIS — Z8261 Family history of arthritis: Secondary | ICD-10-CM

## 2024-04-27 ENCOUNTER — Ambulatory Visit: Admitting: Internal Medicine

## 2024-04-28 ENCOUNTER — Ambulatory Visit: Admitting: Family Medicine

## 2024-06-14 ENCOUNTER — Ambulatory Visit: Admitting: Physician Assistant
